# Patient Record
Sex: Male | Born: 1955 | Hispanic: No | Marital: Married | Smoking: Never smoker
Health system: Southern US, Community
[De-identification: ages and names within clinical notes are randomized; demographics above are authoritative.]

---

## 1999-05-05 ENCOUNTER — Encounter: Admission: RE | Admit: 1999-05-05 | Discharge: 1999-08-03 | Payer: Self-pay | Admitting: Neurosurgery

## 2001-02-12 ENCOUNTER — Encounter: Payer: Self-pay | Admitting: General Practice

## 2001-02-12 ENCOUNTER — Encounter: Admission: RE | Admit: 2001-02-12 | Discharge: 2001-02-12 | Payer: Self-pay | Admitting: General Practice

## 2002-05-02 ENCOUNTER — Ambulatory Visit (HOSPITAL_COMMUNITY): Admission: RE | Admit: 2002-05-02 | Discharge: 2002-05-02 | Payer: Self-pay | Admitting: *Deleted

## 2011-11-29 DIAGNOSIS — Z79899 Other long term (current) drug therapy: Secondary | ICD-10-CM | POA: Diagnosis not present

## 2011-11-29 DIAGNOSIS — M62838 Other muscle spasm: Secondary | ICD-10-CM | POA: Diagnosis not present

## 2011-11-29 DIAGNOSIS — M961 Postlaminectomy syndrome, not elsewhere classified: Secondary | ICD-10-CM | POA: Diagnosis not present

## 2011-12-15 DIAGNOSIS — Z79899 Other long term (current) drug therapy: Secondary | ICD-10-CM | POA: Diagnosis not present

## 2011-12-15 DIAGNOSIS — Z136 Encounter for screening for cardiovascular disorders: Secondary | ICD-10-CM | POA: Diagnosis not present

## 2011-12-15 DIAGNOSIS — Z0000 Encounter for general adult medical examination without abnormal findings: Secondary | ICD-10-CM | POA: Diagnosis not present

## 2011-12-15 DIAGNOSIS — J309 Allergic rhinitis, unspecified: Secondary | ICD-10-CM | POA: Diagnosis not present

## 2011-12-15 DIAGNOSIS — Z125 Encounter for screening for malignant neoplasm of prostate: Secondary | ICD-10-CM | POA: Diagnosis not present

## 2011-12-15 DIAGNOSIS — Z6825 Body mass index (BMI) 25.0-25.9, adult: Secondary | ICD-10-CM | POA: Diagnosis not present

## 2012-04-03 DIAGNOSIS — M961 Postlaminectomy syndrome, not elsewhere classified: Secondary | ICD-10-CM | POA: Insufficient documentation

## 2012-04-03 DIAGNOSIS — Z79899 Other long term (current) drug therapy: Secondary | ICD-10-CM | POA: Diagnosis not present

## 2012-04-03 DIAGNOSIS — IMO0002 Reserved for concepts with insufficient information to code with codable children: Secondary | ICD-10-CM | POA: Diagnosis not present

## 2012-06-10 DIAGNOSIS — R634 Abnormal weight loss: Secondary | ICD-10-CM | POA: Diagnosis not present

## 2012-06-11 DIAGNOSIS — R634 Abnormal weight loss: Secondary | ICD-10-CM | POA: Diagnosis not present

## 2012-07-23 DIAGNOSIS — F1190 Opioid use, unspecified, uncomplicated: Secondary | ICD-10-CM | POA: Insufficient documentation

## 2012-07-23 DIAGNOSIS — M5410 Radiculopathy, site unspecified: Secondary | ICD-10-CM | POA: Insufficient documentation

## 2012-09-16 DIAGNOSIS — Z23 Encounter for immunization: Secondary | ICD-10-CM | POA: Diagnosis not present

## 2012-09-24 DIAGNOSIS — F32A Depression, unspecified: Secondary | ICD-10-CM | POA: Insufficient documentation

## 2012-09-24 DIAGNOSIS — IMO0002 Reserved for concepts with insufficient information to code with codable children: Secondary | ICD-10-CM | POA: Diagnosis not present

## 2012-09-24 DIAGNOSIS — M961 Postlaminectomy syndrome, not elsewhere classified: Secondary | ICD-10-CM | POA: Diagnosis not present

## 2012-12-23 DIAGNOSIS — Z79899 Other long term (current) drug therapy: Secondary | ICD-10-CM | POA: Diagnosis not present

## 2012-12-23 DIAGNOSIS — Z125 Encounter for screening for malignant neoplasm of prostate: Secondary | ICD-10-CM | POA: Diagnosis not present

## 2012-12-23 DIAGNOSIS — Z136 Encounter for screening for cardiovascular disorders: Secondary | ICD-10-CM | POA: Diagnosis not present

## 2012-12-24 DIAGNOSIS — J309 Allergic rhinitis, unspecified: Secondary | ICD-10-CM | POA: Diagnosis not present

## 2012-12-24 DIAGNOSIS — IMO0002 Reserved for concepts with insufficient information to code with codable children: Secondary | ICD-10-CM | POA: Diagnosis not present

## 2012-12-24 DIAGNOSIS — F411 Generalized anxiety disorder: Secondary | ICD-10-CM | POA: Diagnosis not present

## 2012-12-24 DIAGNOSIS — Z0000 Encounter for general adult medical examination without abnormal findings: Secondary | ICD-10-CM | POA: Diagnosis not present

## 2013-03-25 DIAGNOSIS — G894 Chronic pain syndrome: Secondary | ICD-10-CM | POA: Insufficient documentation

## 2013-06-24 DIAGNOSIS — M545 Low back pain, unspecified: Secondary | ICD-10-CM | POA: Diagnosis not present

## 2013-06-24 DIAGNOSIS — F411 Generalized anxiety disorder: Secondary | ICD-10-CM | POA: Diagnosis not present

## 2013-06-24 DIAGNOSIS — J309 Allergic rhinitis, unspecified: Secondary | ICD-10-CM | POA: Diagnosis not present

## 2013-10-02 DIAGNOSIS — Z23 Encounter for immunization: Secondary | ICD-10-CM | POA: Diagnosis not present

## 2013-12-26 DIAGNOSIS — Z136 Encounter for screening for cardiovascular disorders: Secondary | ICD-10-CM | POA: Diagnosis not present

## 2013-12-26 DIAGNOSIS — M545 Low back pain, unspecified: Secondary | ICD-10-CM | POA: Diagnosis not present

## 2013-12-26 DIAGNOSIS — Z23 Encounter for immunization: Secondary | ICD-10-CM | POA: Diagnosis not present

## 2013-12-26 DIAGNOSIS — J309 Allergic rhinitis, unspecified: Secondary | ICD-10-CM | POA: Diagnosis not present

## 2013-12-26 DIAGNOSIS — Z5181 Encounter for therapeutic drug level monitoring: Secondary | ICD-10-CM | POA: Diagnosis not present

## 2013-12-26 DIAGNOSIS — Z125 Encounter for screening for malignant neoplasm of prostate: Secondary | ICD-10-CM | POA: Diagnosis not present

## 2013-12-26 DIAGNOSIS — Z0000 Encounter for general adult medical examination without abnormal findings: Secondary | ICD-10-CM | POA: Diagnosis not present

## 2013-12-26 DIAGNOSIS — F411 Generalized anxiety disorder: Secondary | ICD-10-CM | POA: Diagnosis not present

## 2013-12-26 DIAGNOSIS — Z79899 Other long term (current) drug therapy: Secondary | ICD-10-CM | POA: Diagnosis not present

## 2014-10-01 DIAGNOSIS — Z23 Encounter for immunization: Secondary | ICD-10-CM | POA: Diagnosis not present

## 2015-08-10 ENCOUNTER — Other Ambulatory Visit: Payer: Self-pay | Admitting: Otolaryngology

## 2015-08-10 DIAGNOSIS — R221 Localized swelling, mass and lump, neck: Secondary | ICD-10-CM

## 2015-08-23 ENCOUNTER — Ambulatory Visit
Admission: RE | Admit: 2015-08-23 | Discharge: 2015-08-23 | Disposition: A | Discharge status: Home / Self Care | Payer: Medicare Other | Source: Ambulatory Visit | Attending: Otolaryngology | Admitting: Otolaryngology

## 2015-08-23 DIAGNOSIS — R599 Enlarged lymph nodes, unspecified: Secondary | ICD-10-CM | POA: Diagnosis present

## 2015-08-23 DIAGNOSIS — R221 Localized swelling, mass and lump, neck: Secondary | ICD-10-CM | POA: Diagnosis not present

## 2015-08-23 DIAGNOSIS — M542 Cervicalgia: Secondary | ICD-10-CM | POA: Diagnosis present

## 2015-08-23 MED ORDER — IOHEXOL 300 MG/ML  SOLN
75.0000 mL | Freq: Once | INTRAMUSCULAR | Status: AC | PRN
  Administered 2015-08-23: 75 mL via INTRAVENOUS

## 2015-12-13 DIAGNOSIS — D125 Benign neoplasm of sigmoid colon: Secondary | ICD-10-CM | POA: Diagnosis not present

## 2016-01-03 DIAGNOSIS — Z6826 Body mass index (BMI) 26.0-26.9, adult: Secondary | ICD-10-CM | POA: Diagnosis not present

## 2016-01-03 DIAGNOSIS — Z79899 Other long term (current) drug therapy: Secondary | ICD-10-CM | POA: Diagnosis not present

## 2016-01-03 DIAGNOSIS — Z0000 Encounter for general adult medical examination without abnormal findings: Secondary | ICD-10-CM | POA: Diagnosis not present

## 2016-01-03 DIAGNOSIS — F419 Anxiety disorder, unspecified: Secondary | ICD-10-CM | POA: Diagnosis not present

## 2016-01-03 DIAGNOSIS — Z125 Encounter for screening for malignant neoplasm of prostate: Secondary | ICD-10-CM | POA: Diagnosis not present

## 2016-01-03 DIAGNOSIS — E663 Overweight: Secondary | ICD-10-CM | POA: Diagnosis not present

## 2016-01-03 DIAGNOSIS — M545 Low back pain: Secondary | ICD-10-CM | POA: Diagnosis not present

## 2016-01-03 DIAGNOSIS — Z1389 Encounter for screening for other disorder: Secondary | ICD-10-CM | POA: Diagnosis not present

## 2016-01-03 DIAGNOSIS — Z7289 Other problems related to lifestyle: Secondary | ICD-10-CM | POA: Diagnosis not present

## 2016-10-29 IMAGING — CT CT NECK W/ CM
4 of 5 series · 16 of 33 positions shown, 19 images · IV contrast (omnipaque)
Comparison: None.

CLINICAL DATA: Neck swelling. Left parotid fullness. Enlarged lymph
nodes.

EXAM:
CT NECK WITH CONTRAST
TECHNIQUE: Multidetector CT imaging of the neck was performed using the
standard protocol following the bolus administration of intravenous
contrast.
CONTRAST:  75mL OMNIPAQUE IOHEXOL 300 MG/ML  SOLN

[Series 2: axial · axial · 0.46mm/px · z∈[-280,-162]mm · 3 of 119 slices shown]
[im 30/119  bone]
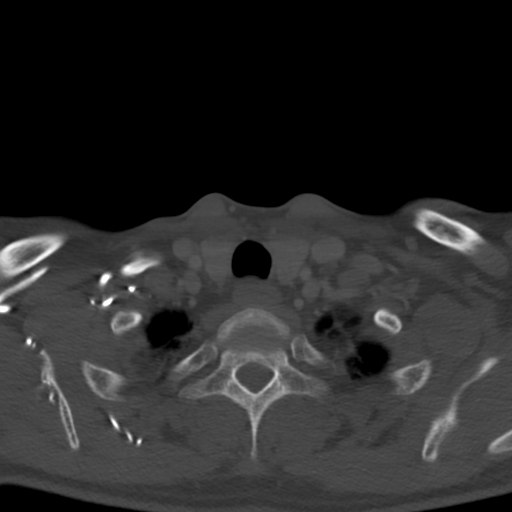
[im 60/119  bone]
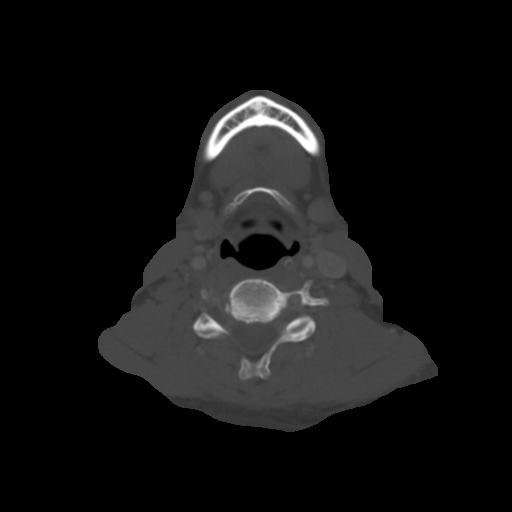
[im 89/119  bone]
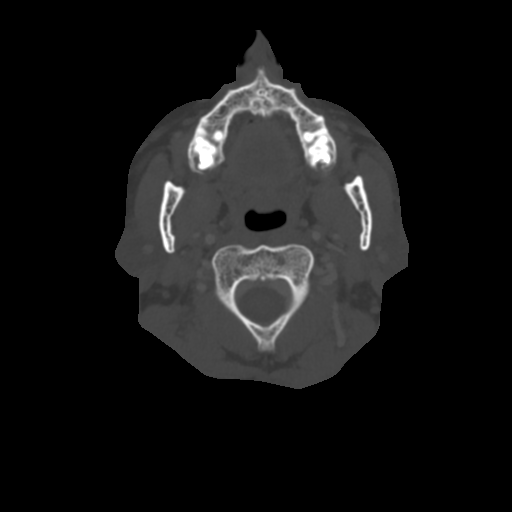

[Series 4: sag neck · sagittal · 0.47mm/px · 5 of 101 slices shown, 6 images]
[im 34/101  bone]
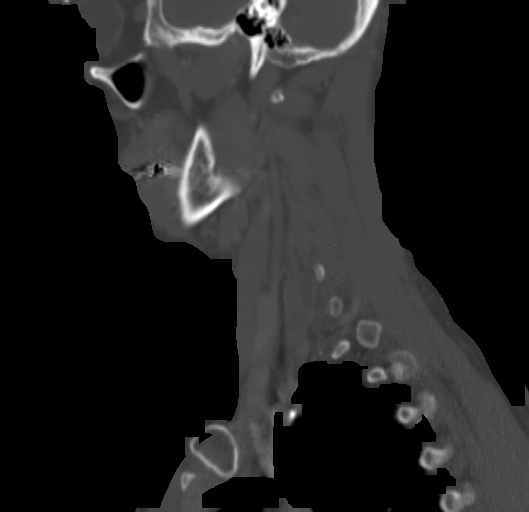
[im 42/101  bone]
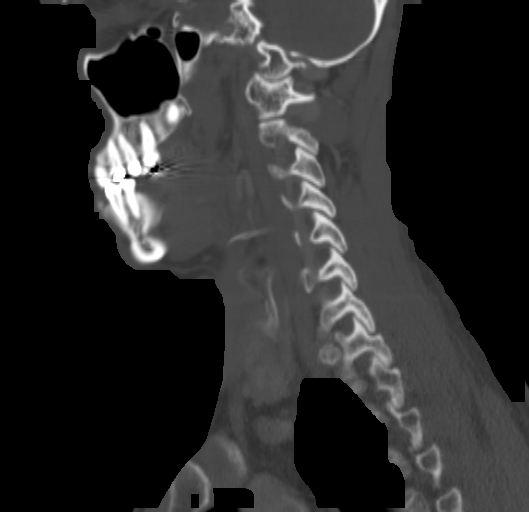
[im 51/101  soft-tissue]
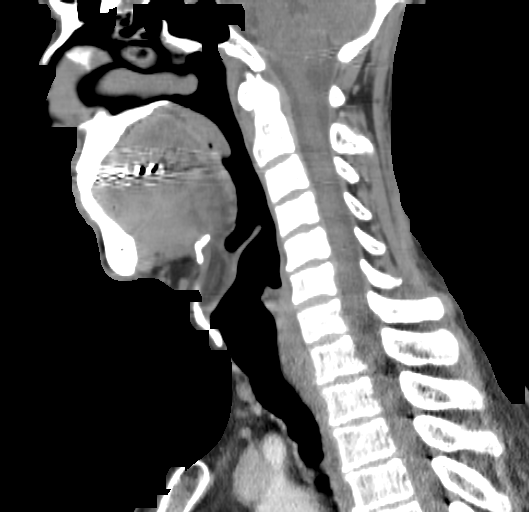
[im 51/101  bone]
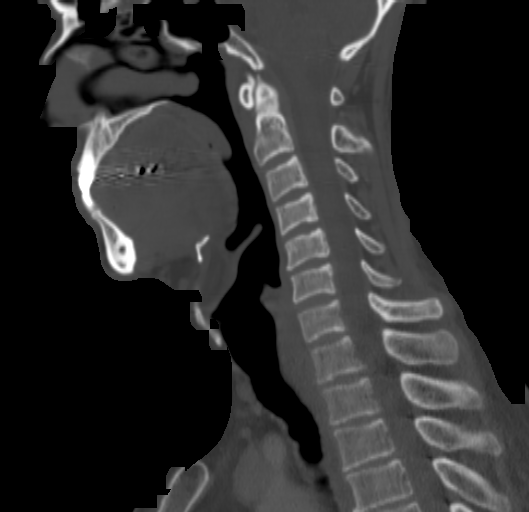
[im 59/101  bone]
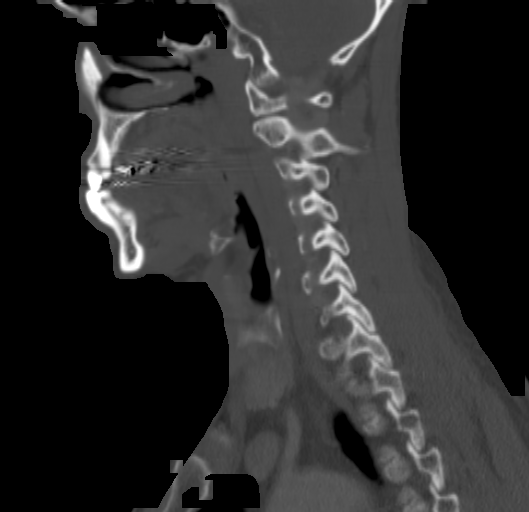
[im 67/101  bone]
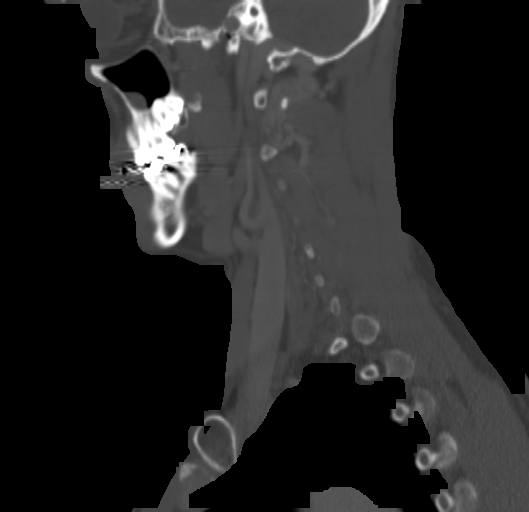

[Series 5: cor neck · coronal · 0.42mm/px · 3 of 113 slices shown]
[im 23/113  bone]
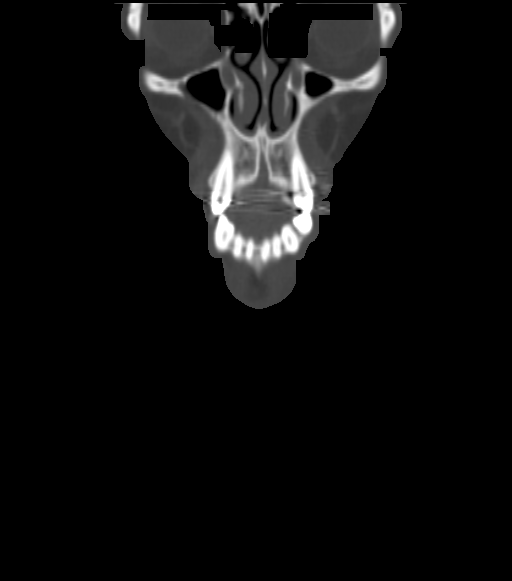
[im 45/113  bone]
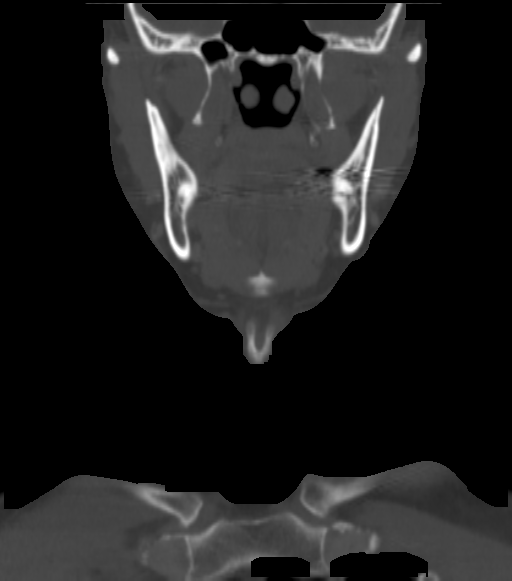
[im 68/113  bone]
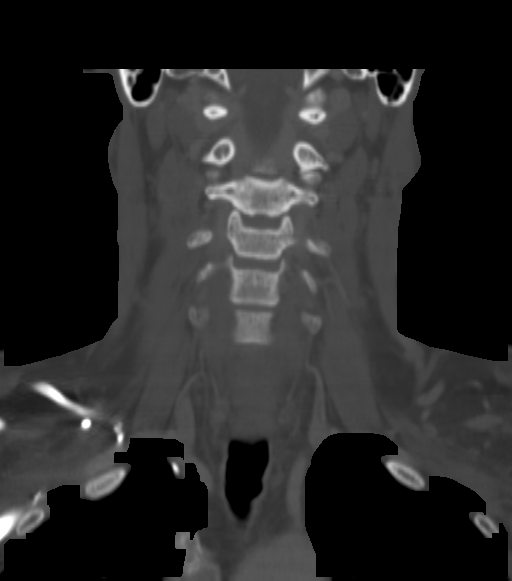

[Series 6: ax oropharynx · axial · 0.39mm/px · z∈[-345,-157]mm · 5 of 148 slices shown, 7 images]
[im 25/148  soft-tissue]
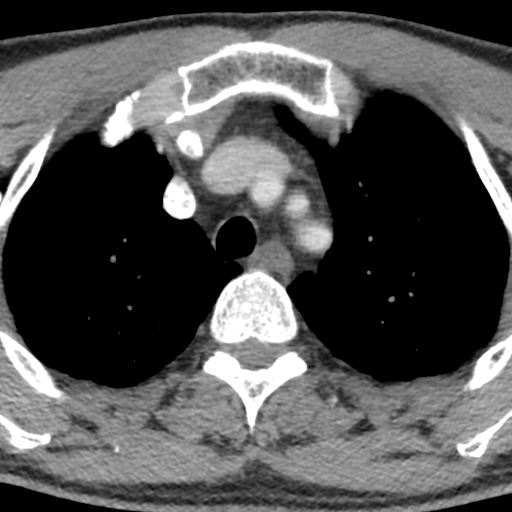
[im 25/148  bone]
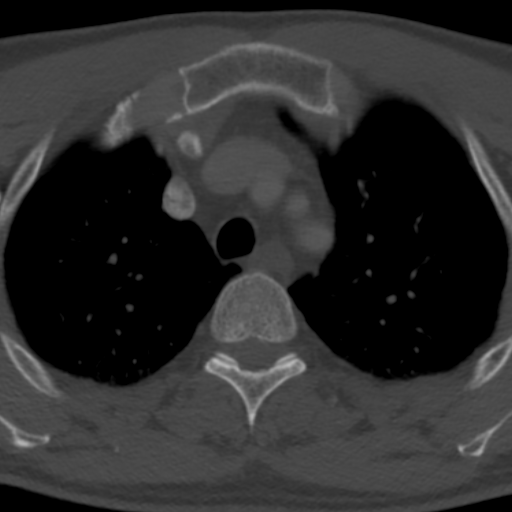
[im 50/148  bone]
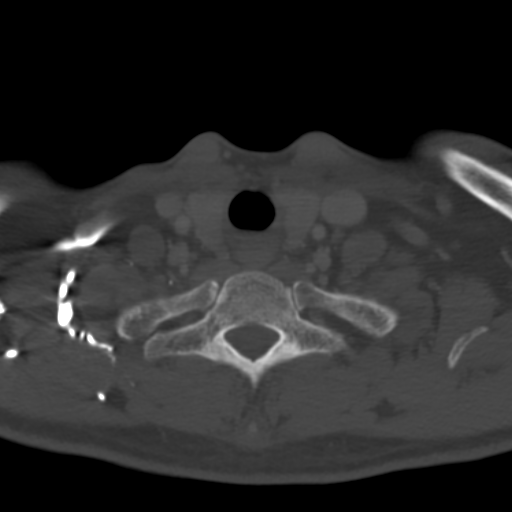
[im 74/148  bone]
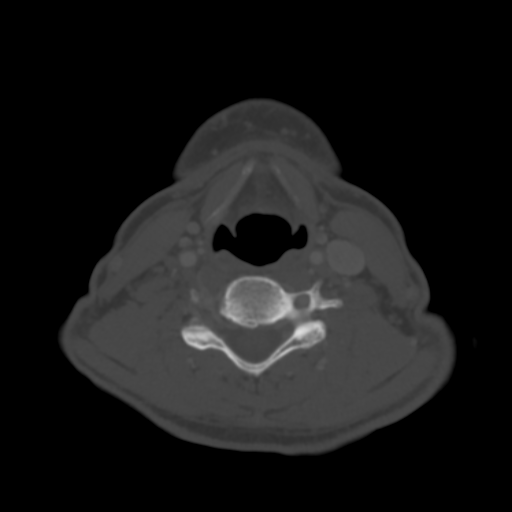
[im 99/148  bone]
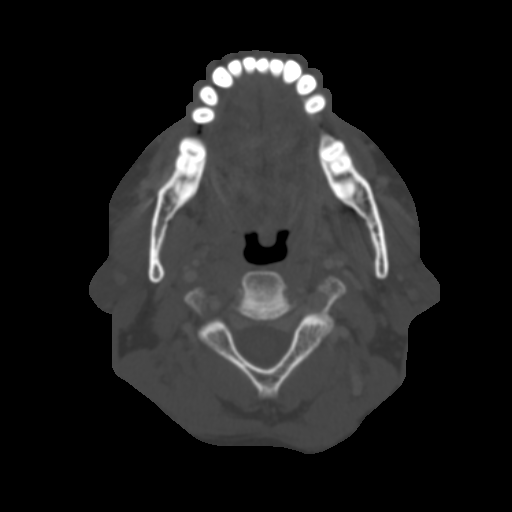
[im 123/148  soft-tissue]
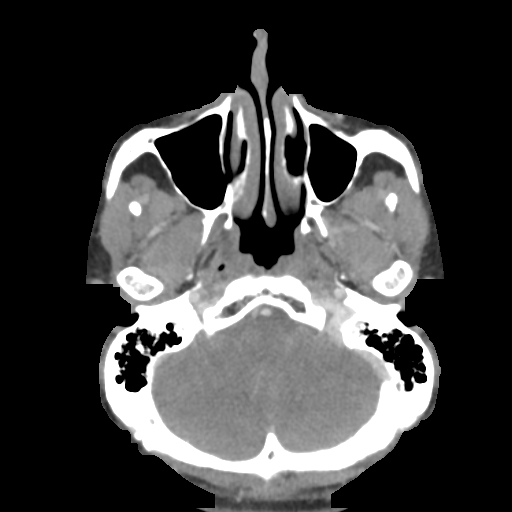
[im 123/148  bone]
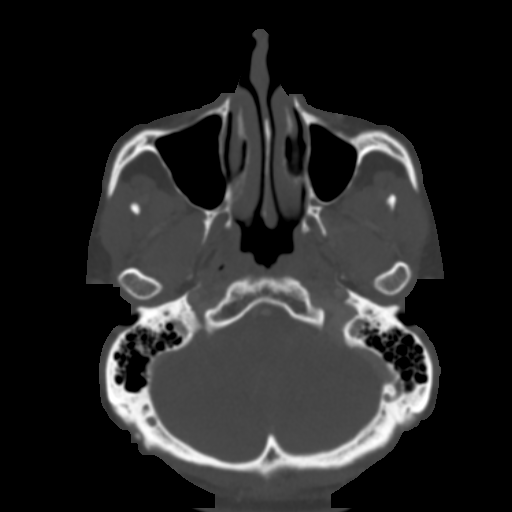

[16 of 33 positions shown; findings below may reference images not displayed]

FINDINGS: Pharynx and larynx: No focal mucosal or submucosal lesions are
present. Vocal cords are midline and symmetric. The tongue base is
unremarkable. Calcifications within the palatine tonsils are
compatible with prior infection or inflammation. The parapharyngeal
fat is clear.

Salivary glands: The parotid glands are within normal limits
bilaterally. No discrete lesion is present. There is no focal lesion
subjacent to the area marked. A subtle area of skin thickening is
noted just anterior and inferior to the area marked. The
submandibular glands are within normal limits.

Thyroid: Negative

Lymph nodes: No significant cervical adenopathy is present.

Vascular: Negative

Limited intracranial: Negative

Visualized orbits: Within normal limits

Mastoids and visualized paranasal sinuses: Clear

Skeleton: Vertebral body heights and alignment are maintained. No
focal lytic or blastic lesions are present.

Upper chest: The lung apices are clear.
IMPRESSION: 1. No acute or focal lesion involving the either parotid gland.
2. Slight skin induration near the area marked could represent a
dermal lesion. Direct inspection is recommended.
3. No significant adenopathy.

## 2017-01-03 DIAGNOSIS — Z79899 Other long term (current) drug therapy: Secondary | ICD-10-CM | POA: Diagnosis not present

## 2017-01-03 DIAGNOSIS — M545 Low back pain: Secondary | ICD-10-CM | POA: Diagnosis not present

## 2017-01-03 DIAGNOSIS — Z6824 Body mass index (BMI) 24.0-24.9, adult: Secondary | ICD-10-CM | POA: Diagnosis not present

## 2017-01-03 DIAGNOSIS — N400 Enlarged prostate without lower urinary tract symptoms: Secondary | ICD-10-CM | POA: Diagnosis not present

## 2017-01-03 DIAGNOSIS — Z0000 Encounter for general adult medical examination without abnormal findings: Secondary | ICD-10-CM | POA: Diagnosis not present

## 2017-01-03 DIAGNOSIS — K219 Gastro-esophageal reflux disease without esophagitis: Secondary | ICD-10-CM | POA: Diagnosis not present

## 2017-01-24 DIAGNOSIS — E662 Morbid (severe) obesity with alveolar hypoventilation: Secondary | ICD-10-CM | POA: Diagnosis not present

## 2017-01-24 DIAGNOSIS — R072 Precordial pain: Secondary | ICD-10-CM | POA: Diagnosis not present

## 2017-01-24 DIAGNOSIS — E663 Overweight: Secondary | ICD-10-CM | POA: Diagnosis not present

## 2017-01-24 DIAGNOSIS — Z6825 Body mass index (BMI) 25.0-25.9, adult: Secondary | ICD-10-CM | POA: Diagnosis not present

## 2017-02-05 DIAGNOSIS — R079 Chest pain, unspecified: Secondary | ICD-10-CM | POA: Diagnosis not present

## 2017-02-05 DIAGNOSIS — Z6825 Body mass index (BMI) 25.0-25.9, adult: Secondary | ICD-10-CM | POA: Diagnosis not present

## 2017-02-07 DIAGNOSIS — R079 Chest pain, unspecified: Secondary | ICD-10-CM | POA: Diagnosis not present

## 2017-02-07 DIAGNOSIS — R072 Precordial pain: Secondary | ICD-10-CM | POA: Diagnosis not present

## 2017-02-07 DIAGNOSIS — K2900 Acute gastritis without bleeding: Secondary | ICD-10-CM | POA: Diagnosis not present

## 2017-04-24 DIAGNOSIS — R12 Heartburn: Secondary | ICD-10-CM | POA: Diagnosis not present

## 2017-04-25 DIAGNOSIS — R079 Chest pain, unspecified: Secondary | ICD-10-CM | POA: Diagnosis not present

## 2017-04-25 DIAGNOSIS — R0789 Other chest pain: Secondary | ICD-10-CM | POA: Diagnosis not present

## 2017-04-25 DIAGNOSIS — K219 Gastro-esophageal reflux disease without esophagitis: Secondary | ICD-10-CM | POA: Diagnosis not present

## 2017-04-25 DIAGNOSIS — R12 Heartburn: Secondary | ICD-10-CM | POA: Diagnosis not present

## 2017-05-29 DIAGNOSIS — K219 Gastro-esophageal reflux disease without esophagitis: Secondary | ICD-10-CM | POA: Diagnosis not present

## 2017-06-25 DIAGNOSIS — F329 Major depressive disorder, single episode, unspecified: Secondary | ICD-10-CM | POA: Diagnosis not present

## 2017-06-25 DIAGNOSIS — Z1389 Encounter for screening for other disorder: Secondary | ICD-10-CM | POA: Diagnosis not present

## 2017-06-25 DIAGNOSIS — Z6825 Body mass index (BMI) 25.0-25.9, adult: Secondary | ICD-10-CM | POA: Diagnosis not present

## 2017-06-25 DIAGNOSIS — M545 Low back pain: Secondary | ICD-10-CM | POA: Diagnosis not present

## 2017-06-25 DIAGNOSIS — K219 Gastro-esophageal reflux disease without esophagitis: Secondary | ICD-10-CM | POA: Diagnosis not present

## 2017-06-25 DIAGNOSIS — Z79899 Other long term (current) drug therapy: Secondary | ICD-10-CM | POA: Diagnosis not present

## 2017-07-16 DIAGNOSIS — Z6824 Body mass index (BMI) 24.0-24.9, adult: Secondary | ICD-10-CM | POA: Diagnosis not present

## 2017-07-16 DIAGNOSIS — R109 Unspecified abdominal pain: Secondary | ICD-10-CM | POA: Diagnosis not present

## 2017-07-27 DIAGNOSIS — R109 Unspecified abdominal pain: Secondary | ICD-10-CM | POA: Diagnosis not present

## 2017-07-27 DIAGNOSIS — R319 Hematuria, unspecified: Secondary | ICD-10-CM | POA: Diagnosis not present

## 2017-07-27 DIAGNOSIS — N433 Hydrocele, unspecified: Secondary | ICD-10-CM | POA: Diagnosis not present

## 2017-09-25 DIAGNOSIS — M545 Low back pain: Secondary | ICD-10-CM | POA: Diagnosis not present

## 2017-09-25 DIAGNOSIS — F329 Major depressive disorder, single episode, unspecified: Secondary | ICD-10-CM | POA: Diagnosis not present

## 2017-09-25 DIAGNOSIS — Z79899 Other long term (current) drug therapy: Secondary | ICD-10-CM | POA: Diagnosis not present

## 2017-09-25 DIAGNOSIS — K219 Gastro-esophageal reflux disease without esophagitis: Secondary | ICD-10-CM | POA: Diagnosis not present

## 2017-09-25 DIAGNOSIS — Z23 Encounter for immunization: Secondary | ICD-10-CM | POA: Diagnosis not present

## 2017-11-27 DIAGNOSIS — Z6825 Body mass index (BMI) 25.0-25.9, adult: Secondary | ICD-10-CM | POA: Diagnosis not present

## 2017-11-27 DIAGNOSIS — M545 Low back pain: Secondary | ICD-10-CM | POA: Diagnosis not present

## 2017-12-13 DIAGNOSIS — H524 Presbyopia: Secondary | ICD-10-CM | POA: Diagnosis not present

## 2017-12-18 DIAGNOSIS — M545 Low back pain: Secondary | ICD-10-CM | POA: Diagnosis not present

## 2017-12-18 DIAGNOSIS — R69 Illness, unspecified: Secondary | ICD-10-CM | POA: Diagnosis not present

## 2017-12-18 DIAGNOSIS — M542 Cervicalgia: Secondary | ICD-10-CM | POA: Diagnosis not present

## 2018-01-07 DIAGNOSIS — Z125 Encounter for screening for malignant neoplasm of prostate: Secondary | ICD-10-CM | POA: Diagnosis not present

## 2018-01-07 DIAGNOSIS — Z0000 Encounter for general adult medical examination without abnormal findings: Secondary | ICD-10-CM | POA: Diagnosis not present

## 2018-01-07 DIAGNOSIS — Z23 Encounter for immunization: Secondary | ICD-10-CM | POA: Diagnosis not present

## 2018-02-28 DIAGNOSIS — Z13220 Encounter for screening for lipoid disorders: Secondary | ICD-10-CM | POA: Diagnosis not present

## 2018-02-28 DIAGNOSIS — Z79899 Other long term (current) drug therapy: Secondary | ICD-10-CM | POA: Diagnosis not present

## 2018-02-28 DIAGNOSIS — Z125 Encounter for screening for malignant neoplasm of prostate: Secondary | ICD-10-CM | POA: Diagnosis not present

## 2018-03-04 DIAGNOSIS — Z79899 Other long term (current) drug therapy: Secondary | ICD-10-CM | POA: Diagnosis not present

## 2018-03-04 DIAGNOSIS — M545 Low back pain: Secondary | ICD-10-CM | POA: Diagnosis not present

## 2018-03-04 DIAGNOSIS — Z6824 Body mass index (BMI) 24.0-24.9, adult: Secondary | ICD-10-CM | POA: Diagnosis not present

## 2018-03-27 DIAGNOSIS — M545 Low back pain: Secondary | ICD-10-CM | POA: Diagnosis not present

## 2018-03-27 DIAGNOSIS — M6281 Muscle weakness (generalized): Secondary | ICD-10-CM | POA: Diagnosis not present

## 2018-04-03 DIAGNOSIS — M6281 Muscle weakness (generalized): Secondary | ICD-10-CM | POA: Diagnosis not present

## 2018-04-03 DIAGNOSIS — M545 Low back pain: Secondary | ICD-10-CM | POA: Diagnosis not present

## 2018-04-10 DIAGNOSIS — M545 Low back pain: Secondary | ICD-10-CM | POA: Diagnosis not present

## 2018-04-10 DIAGNOSIS — M6281 Muscle weakness (generalized): Secondary | ICD-10-CM | POA: Diagnosis not present

## 2018-04-17 DIAGNOSIS — M6281 Muscle weakness (generalized): Secondary | ICD-10-CM | POA: Diagnosis not present

## 2018-04-17 DIAGNOSIS — M545 Low back pain: Secondary | ICD-10-CM | POA: Diagnosis not present

## 2018-04-24 DIAGNOSIS — M545 Low back pain: Secondary | ICD-10-CM | POA: Diagnosis not present

## 2018-04-24 DIAGNOSIS — M6281 Muscle weakness (generalized): Secondary | ICD-10-CM | POA: Diagnosis not present

## 2018-05-01 DIAGNOSIS — M6281 Muscle weakness (generalized): Secondary | ICD-10-CM | POA: Diagnosis not present

## 2018-05-01 DIAGNOSIS — M545 Low back pain: Secondary | ICD-10-CM | POA: Diagnosis not present

## 2018-05-08 DIAGNOSIS — M6281 Muscle weakness (generalized): Secondary | ICD-10-CM | POA: Diagnosis not present

## 2018-05-08 DIAGNOSIS — M545 Low back pain: Secondary | ICD-10-CM | POA: Diagnosis not present

## 2018-05-14 DIAGNOSIS — M6281 Muscle weakness (generalized): Secondary | ICD-10-CM | POA: Diagnosis not present

## 2018-05-14 DIAGNOSIS — M545 Low back pain: Secondary | ICD-10-CM | POA: Diagnosis not present

## 2018-06-05 DIAGNOSIS — M1990 Unspecified osteoarthritis, unspecified site: Secondary | ICD-10-CM | POA: Diagnosis not present

## 2018-06-05 DIAGNOSIS — H547 Unspecified visual loss: Secondary | ICD-10-CM | POA: Diagnosis not present

## 2018-06-05 DIAGNOSIS — G629 Polyneuropathy, unspecified: Secondary | ICD-10-CM | POA: Diagnosis not present

## 2018-06-05 DIAGNOSIS — G8929 Other chronic pain: Secondary | ICD-10-CM | POA: Diagnosis not present

## 2018-06-05 DIAGNOSIS — H269 Unspecified cataract: Secondary | ICD-10-CM | POA: Diagnosis not present

## 2018-06-05 DIAGNOSIS — F4321 Adjustment disorder with depressed mood: Secondary | ICD-10-CM | POA: Diagnosis not present

## 2018-06-05 DIAGNOSIS — Z791 Long term (current) use of non-steroidal anti-inflammatories (NSAID): Secondary | ICD-10-CM | POA: Diagnosis not present

## 2018-06-05 DIAGNOSIS — R69 Illness, unspecified: Secondary | ICD-10-CM | POA: Diagnosis not present

## 2018-06-05 DIAGNOSIS — J309 Allergic rhinitis, unspecified: Secondary | ICD-10-CM | POA: Diagnosis not present

## 2018-06-05 DIAGNOSIS — K219 Gastro-esophageal reflux disease without esophagitis: Secondary | ICD-10-CM | POA: Diagnosis not present

## 2018-07-01 DIAGNOSIS — Z1339 Encounter for screening examination for other mental health and behavioral disorders: Secondary | ICD-10-CM | POA: Diagnosis not present

## 2018-07-01 DIAGNOSIS — K219 Gastro-esophageal reflux disease without esophagitis: Secondary | ICD-10-CM | POA: Diagnosis not present

## 2018-07-01 DIAGNOSIS — Z1331 Encounter for screening for depression: Secondary | ICD-10-CM | POA: Diagnosis not present

## 2018-07-01 DIAGNOSIS — J309 Allergic rhinitis, unspecified: Secondary | ICD-10-CM | POA: Diagnosis not present

## 2018-07-01 DIAGNOSIS — M545 Low back pain: Secondary | ICD-10-CM | POA: Diagnosis not present

## 2018-07-01 DIAGNOSIS — Z6824 Body mass index (BMI) 24.0-24.9, adult: Secondary | ICD-10-CM | POA: Diagnosis not present

## 2018-07-16 DIAGNOSIS — R69 Illness, unspecified: Secondary | ICD-10-CM | POA: Diagnosis not present

## 2018-09-03 DIAGNOSIS — Z23 Encounter for immunization: Secondary | ICD-10-CM | POA: Diagnosis not present

## 2018-10-01 DIAGNOSIS — Z1331 Encounter for screening for depression: Secondary | ICD-10-CM | POA: Diagnosis not present

## 2018-10-01 DIAGNOSIS — Z6824 Body mass index (BMI) 24.0-24.9, adult: Secondary | ICD-10-CM | POA: Diagnosis not present

## 2018-10-01 DIAGNOSIS — M545 Low back pain: Secondary | ICD-10-CM | POA: Diagnosis not present

## 2018-10-01 DIAGNOSIS — K219 Gastro-esophageal reflux disease without esophagitis: Secondary | ICD-10-CM | POA: Diagnosis not present

## 2018-10-01 DIAGNOSIS — Z79899 Other long term (current) drug therapy: Secondary | ICD-10-CM | POA: Diagnosis not present

## 2018-11-27 DIAGNOSIS — Z6824 Body mass index (BMI) 24.0-24.9, adult: Secondary | ICD-10-CM | POA: Diagnosis not present

## 2018-11-27 DIAGNOSIS — J209 Acute bronchitis, unspecified: Secondary | ICD-10-CM | POA: Diagnosis not present

## 2018-11-27 DIAGNOSIS — J019 Acute sinusitis, unspecified: Secondary | ICD-10-CM | POA: Diagnosis not present

## 2018-12-12 DIAGNOSIS — G8929 Other chronic pain: Secondary | ICD-10-CM | POA: Diagnosis not present

## 2018-12-12 DIAGNOSIS — J309 Allergic rhinitis, unspecified: Secondary | ICD-10-CM | POA: Diagnosis not present

## 2018-12-12 DIAGNOSIS — K219 Gastro-esophageal reflux disease without esophagitis: Secondary | ICD-10-CM | POA: Diagnosis not present

## 2018-12-12 DIAGNOSIS — Z809 Family history of malignant neoplasm, unspecified: Secondary | ICD-10-CM | POA: Diagnosis not present

## 2018-12-12 DIAGNOSIS — G629 Polyneuropathy, unspecified: Secondary | ICD-10-CM | POA: Diagnosis not present

## 2018-12-12 DIAGNOSIS — Z833 Family history of diabetes mellitus: Secondary | ICD-10-CM | POA: Diagnosis not present

## 2018-12-12 DIAGNOSIS — Z791 Long term (current) use of non-steroidal anti-inflammatories (NSAID): Secondary | ICD-10-CM | POA: Diagnosis not present

## 2018-12-12 DIAGNOSIS — Z8249 Family history of ischemic heart disease and other diseases of the circulatory system: Secondary | ICD-10-CM | POA: Diagnosis not present

## 2018-12-12 DIAGNOSIS — Z79891 Long term (current) use of opiate analgesic: Secondary | ICD-10-CM | POA: Diagnosis not present

## 2019-01-01 DIAGNOSIS — M67912 Unspecified disorder of synovium and tendon, left shoulder: Secondary | ICD-10-CM | POA: Diagnosis not present

## 2019-01-01 DIAGNOSIS — M545 Low back pain: Secondary | ICD-10-CM | POA: Diagnosis not present

## 2019-01-01 DIAGNOSIS — J309 Allergic rhinitis, unspecified: Secondary | ICD-10-CM | POA: Diagnosis not present

## 2019-01-01 DIAGNOSIS — Z6824 Body mass index (BMI) 24.0-24.9, adult: Secondary | ICD-10-CM | POA: Diagnosis not present

## 2019-01-01 DIAGNOSIS — K219 Gastro-esophageal reflux disease without esophagitis: Secondary | ICD-10-CM | POA: Diagnosis not present

## 2019-02-25 DIAGNOSIS — R69 Illness, unspecified: Secondary | ICD-10-CM | POA: Diagnosis not present

## 2019-03-13 DIAGNOSIS — Z1331 Encounter for screening for depression: Secondary | ICD-10-CM | POA: Diagnosis not present

## 2019-03-13 DIAGNOSIS — Z125 Encounter for screening for malignant neoplasm of prostate: Secondary | ICD-10-CM | POA: Diagnosis not present

## 2019-03-13 DIAGNOSIS — Z0000 Encounter for general adult medical examination without abnormal findings: Secondary | ICD-10-CM | POA: Diagnosis not present

## 2019-03-13 DIAGNOSIS — Z9181 History of falling: Secondary | ICD-10-CM | POA: Diagnosis not present

## 2019-04-07 DIAGNOSIS — K219 Gastro-esophageal reflux disease without esophagitis: Secondary | ICD-10-CM | POA: Diagnosis not present

## 2019-04-07 DIAGNOSIS — Z125 Encounter for screening for malignant neoplasm of prostate: Secondary | ICD-10-CM | POA: Diagnosis not present

## 2019-04-07 DIAGNOSIS — J309 Allergic rhinitis, unspecified: Secondary | ICD-10-CM | POA: Diagnosis not present

## 2019-04-07 DIAGNOSIS — Z13220 Encounter for screening for lipoid disorders: Secondary | ICD-10-CM | POA: Diagnosis not present

## 2019-04-07 DIAGNOSIS — M545 Low back pain: Secondary | ICD-10-CM | POA: Diagnosis not present

## 2019-04-07 DIAGNOSIS — Z6824 Body mass index (BMI) 24.0-24.9, adult: Secondary | ICD-10-CM | POA: Diagnosis not present

## 2019-04-07 DIAGNOSIS — Z79899 Other long term (current) drug therapy: Secondary | ICD-10-CM | POA: Diagnosis not present

## 2019-07-15 DIAGNOSIS — Z6823 Body mass index (BMI) 23.0-23.9, adult: Secondary | ICD-10-CM | POA: Diagnosis not present

## 2019-07-15 DIAGNOSIS — K219 Gastro-esophageal reflux disease without esophagitis: Secondary | ICD-10-CM | POA: Diagnosis not present

## 2019-07-15 DIAGNOSIS — J309 Allergic rhinitis, unspecified: Secondary | ICD-10-CM | POA: Diagnosis not present

## 2019-07-15 DIAGNOSIS — M545 Low back pain: Secondary | ICD-10-CM | POA: Diagnosis not present

## 2019-07-15 DIAGNOSIS — R634 Abnormal weight loss: Secondary | ICD-10-CM | POA: Diagnosis not present

## 2019-09-04 DIAGNOSIS — Z23 Encounter for immunization: Secondary | ICD-10-CM | POA: Diagnosis not present

## 2019-09-09 DIAGNOSIS — R69 Illness, unspecified: Secondary | ICD-10-CM | POA: Diagnosis not present

## 2019-10-20 DIAGNOSIS — J309 Allergic rhinitis, unspecified: Secondary | ICD-10-CM | POA: Diagnosis not present

## 2019-10-20 DIAGNOSIS — M545 Low back pain: Secondary | ICD-10-CM | POA: Diagnosis not present

## 2019-10-20 DIAGNOSIS — K219 Gastro-esophageal reflux disease without esophagitis: Secondary | ICD-10-CM | POA: Diagnosis not present

## 2019-10-20 DIAGNOSIS — Z6824 Body mass index (BMI) 24.0-24.9, adult: Secondary | ICD-10-CM | POA: Diagnosis not present

## 2019-10-20 DIAGNOSIS — Z79899 Other long term (current) drug therapy: Secondary | ICD-10-CM | POA: Diagnosis not present

## 2020-01-21 DIAGNOSIS — H524 Presbyopia: Secondary | ICD-10-CM | POA: Diagnosis not present

## 2020-01-21 DIAGNOSIS — J309 Allergic rhinitis, unspecified: Secondary | ICD-10-CM | POA: Diagnosis not present

## 2020-01-21 DIAGNOSIS — M545 Low back pain: Secondary | ICD-10-CM | POA: Diagnosis not present

## 2020-01-21 DIAGNOSIS — K219 Gastro-esophageal reflux disease without esophagitis: Secondary | ICD-10-CM | POA: Diagnosis not present

## 2020-01-21 DIAGNOSIS — Z6825 Body mass index (BMI) 25.0-25.9, adult: Secondary | ICD-10-CM | POA: Diagnosis not present

## 2020-02-02 DIAGNOSIS — Z0100 Encounter for examination of eyes and vision without abnormal findings: Secondary | ICD-10-CM | POA: Diagnosis not present

## 2020-04-28 DIAGNOSIS — Z79899 Other long term (current) drug therapy: Secondary | ICD-10-CM | POA: Diagnosis not present

## 2020-04-28 DIAGNOSIS — Z125 Encounter for screening for malignant neoplasm of prostate: Secondary | ICD-10-CM | POA: Diagnosis not present

## 2020-04-28 DIAGNOSIS — Z6824 Body mass index (BMI) 24.0-24.9, adult: Secondary | ICD-10-CM | POA: Diagnosis not present

## 2020-04-28 DIAGNOSIS — M545 Low back pain: Secondary | ICD-10-CM | POA: Diagnosis not present

## 2020-04-28 DIAGNOSIS — Z13220 Encounter for screening for lipoid disorders: Secondary | ICD-10-CM | POA: Diagnosis not present

## 2020-04-28 DIAGNOSIS — R5383 Other fatigue: Secondary | ICD-10-CM | POA: Diagnosis not present

## 2020-04-28 DIAGNOSIS — K219 Gastro-esophageal reflux disease without esophagitis: Secondary | ICD-10-CM | POA: Diagnosis not present

## 2020-04-28 DIAGNOSIS — J309 Allergic rhinitis, unspecified: Secondary | ICD-10-CM | POA: Diagnosis not present

## 2020-07-13 DIAGNOSIS — Z1152 Encounter for screening for COVID-19: Secondary | ICD-10-CM | POA: Diagnosis not present

## 2020-07-13 DIAGNOSIS — M25512 Pain in left shoulder: Secondary | ICD-10-CM | POA: Diagnosis not present

## 2020-07-30 DIAGNOSIS — J309 Allergic rhinitis, unspecified: Secondary | ICD-10-CM | POA: Diagnosis not present

## 2020-07-30 DIAGNOSIS — R69 Illness, unspecified: Secondary | ICD-10-CM | POA: Diagnosis not present

## 2020-07-30 DIAGNOSIS — S4352XA Sprain of left acromioclavicular joint, initial encounter: Secondary | ICD-10-CM | POA: Diagnosis not present

## 2020-07-30 DIAGNOSIS — M545 Low back pain: Secondary | ICD-10-CM | POA: Diagnosis not present

## 2020-07-30 DIAGNOSIS — Z6824 Body mass index (BMI) 24.0-24.9, adult: Secondary | ICD-10-CM | POA: Diagnosis not present

## 2020-07-30 DIAGNOSIS — R5383 Other fatigue: Secondary | ICD-10-CM | POA: Diagnosis not present

## 2020-10-29 DIAGNOSIS — J309 Allergic rhinitis, unspecified: Secondary | ICD-10-CM | POA: Diagnosis not present

## 2020-10-29 DIAGNOSIS — Z6824 Body mass index (BMI) 24.0-24.9, adult: Secondary | ICD-10-CM | POA: Diagnosis not present

## 2020-10-29 DIAGNOSIS — K219 Gastro-esophageal reflux disease without esophagitis: Secondary | ICD-10-CM | POA: Diagnosis not present

## 2020-10-29 DIAGNOSIS — Z79899 Other long term (current) drug therapy: Secondary | ICD-10-CM | POA: Diagnosis not present

## 2020-10-29 DIAGNOSIS — E7800 Pure hypercholesterolemia, unspecified: Secondary | ICD-10-CM | POA: Diagnosis not present

## 2020-10-29 DIAGNOSIS — M545 Low back pain, unspecified: Secondary | ICD-10-CM | POA: Diagnosis not present

## 2020-10-29 DIAGNOSIS — Z86010 Personal history of colonic polyps: Secondary | ICD-10-CM | POA: Diagnosis not present

## 2020-12-29 DIAGNOSIS — M6281 Muscle weakness (generalized): Secondary | ICD-10-CM | POA: Diagnosis not present

## 2020-12-29 DIAGNOSIS — M5432 Sciatica, left side: Secondary | ICD-10-CM | POA: Diagnosis not present

## 2020-12-29 DIAGNOSIS — M545 Low back pain, unspecified: Secondary | ICD-10-CM | POA: Diagnosis not present

## 2021-01-04 DIAGNOSIS — M6281 Muscle weakness (generalized): Secondary | ICD-10-CM | POA: Diagnosis not present

## 2021-01-04 DIAGNOSIS — M5432 Sciatica, left side: Secondary | ICD-10-CM | POA: Diagnosis not present

## 2021-01-04 DIAGNOSIS — M545 Low back pain, unspecified: Secondary | ICD-10-CM | POA: Diagnosis not present

## 2021-01-06 DIAGNOSIS — M6281 Muscle weakness (generalized): Secondary | ICD-10-CM | POA: Diagnosis not present

## 2021-01-06 DIAGNOSIS — M5432 Sciatica, left side: Secondary | ICD-10-CM | POA: Diagnosis not present

## 2021-01-06 DIAGNOSIS — M545 Low back pain, unspecified: Secondary | ICD-10-CM | POA: Diagnosis not present

## 2021-01-11 DIAGNOSIS — M545 Low back pain, unspecified: Secondary | ICD-10-CM | POA: Diagnosis not present

## 2021-01-11 DIAGNOSIS — M5432 Sciatica, left side: Secondary | ICD-10-CM | POA: Diagnosis not present

## 2021-01-11 DIAGNOSIS — M6281 Muscle weakness (generalized): Secondary | ICD-10-CM | POA: Diagnosis not present

## 2021-01-13 DIAGNOSIS — M545 Low back pain, unspecified: Secondary | ICD-10-CM | POA: Diagnosis not present

## 2021-01-13 DIAGNOSIS — M5432 Sciatica, left side: Secondary | ICD-10-CM | POA: Diagnosis not present

## 2021-01-13 DIAGNOSIS — M6281 Muscle weakness (generalized): Secondary | ICD-10-CM | POA: Diagnosis not present

## 2021-01-18 DIAGNOSIS — M6281 Muscle weakness (generalized): Secondary | ICD-10-CM | POA: Diagnosis not present

## 2021-01-18 DIAGNOSIS — M545 Low back pain, unspecified: Secondary | ICD-10-CM | POA: Diagnosis not present

## 2021-01-18 DIAGNOSIS — M5432 Sciatica, left side: Secondary | ICD-10-CM | POA: Diagnosis not present

## 2021-01-25 DIAGNOSIS — M545 Low back pain, unspecified: Secondary | ICD-10-CM | POA: Diagnosis not present

## 2021-01-25 DIAGNOSIS — M5432 Sciatica, left side: Secondary | ICD-10-CM | POA: Diagnosis not present

## 2021-01-25 DIAGNOSIS — M6281 Muscle weakness (generalized): Secondary | ICD-10-CM | POA: Diagnosis not present

## 2021-02-01 DIAGNOSIS — M5432 Sciatica, left side: Secondary | ICD-10-CM | POA: Diagnosis not present

## 2021-02-01 DIAGNOSIS — M545 Low back pain, unspecified: Secondary | ICD-10-CM | POA: Diagnosis not present

## 2021-02-01 DIAGNOSIS — M6281 Muscle weakness (generalized): Secondary | ICD-10-CM | POA: Diagnosis not present

## 2021-02-03 DIAGNOSIS — M5432 Sciatica, left side: Secondary | ICD-10-CM | POA: Diagnosis not present

## 2021-02-03 DIAGNOSIS — M6281 Muscle weakness (generalized): Secondary | ICD-10-CM | POA: Diagnosis not present

## 2021-02-03 DIAGNOSIS — M545 Low back pain, unspecified: Secondary | ICD-10-CM | POA: Diagnosis not present

## 2021-02-09 DIAGNOSIS — M5432 Sciatica, left side: Secondary | ICD-10-CM | POA: Diagnosis not present

## 2021-02-09 DIAGNOSIS — M545 Low back pain, unspecified: Secondary | ICD-10-CM | POA: Diagnosis not present

## 2021-02-09 DIAGNOSIS — M6281 Muscle weakness (generalized): Secondary | ICD-10-CM | POA: Diagnosis not present

## 2021-02-15 DIAGNOSIS — M545 Low back pain, unspecified: Secondary | ICD-10-CM | POA: Diagnosis not present

## 2021-02-15 DIAGNOSIS — M6281 Muscle weakness (generalized): Secondary | ICD-10-CM | POA: Diagnosis not present

## 2021-02-15 DIAGNOSIS — M5432 Sciatica, left side: Secondary | ICD-10-CM | POA: Diagnosis not present

## 2021-02-22 DIAGNOSIS — Z6825 Body mass index (BMI) 25.0-25.9, adult: Secondary | ICD-10-CM | POA: Diagnosis not present

## 2021-02-22 DIAGNOSIS — M545 Low back pain, unspecified: Secondary | ICD-10-CM | POA: Diagnosis not present

## 2021-02-22 DIAGNOSIS — J309 Allergic rhinitis, unspecified: Secondary | ICD-10-CM | POA: Diagnosis not present

## 2021-02-22 DIAGNOSIS — E7800 Pure hypercholesterolemia, unspecified: Secondary | ICD-10-CM | POA: Diagnosis not present

## 2021-02-22 DIAGNOSIS — Z86010 Personal history of colonic polyps: Secondary | ICD-10-CM | POA: Diagnosis not present

## 2021-02-25 DIAGNOSIS — M545 Low back pain, unspecified: Secondary | ICD-10-CM | POA: Diagnosis not present

## 2021-02-25 DIAGNOSIS — M6281 Muscle weakness (generalized): Secondary | ICD-10-CM | POA: Diagnosis not present

## 2021-02-25 DIAGNOSIS — M5432 Sciatica, left side: Secondary | ICD-10-CM | POA: Diagnosis not present

## 2021-05-27 DIAGNOSIS — Z1211 Encounter for screening for malignant neoplasm of colon: Secondary | ICD-10-CM | POA: Diagnosis not present

## 2021-05-27 DIAGNOSIS — Z86010 Personal history of colonic polyps: Secondary | ICD-10-CM | POA: Diagnosis not present

## 2021-06-07 DIAGNOSIS — J309 Allergic rhinitis, unspecified: Secondary | ICD-10-CM | POA: Diagnosis not present

## 2021-06-07 DIAGNOSIS — E7800 Pure hypercholesterolemia, unspecified: Secondary | ICD-10-CM | POA: Diagnosis not present

## 2021-06-07 DIAGNOSIS — Z125 Encounter for screening for malignant neoplasm of prostate: Secondary | ICD-10-CM | POA: Diagnosis not present

## 2021-06-07 DIAGNOSIS — M545 Low back pain, unspecified: Secondary | ICD-10-CM | POA: Diagnosis not present

## 2021-06-07 DIAGNOSIS — Z6825 Body mass index (BMI) 25.0-25.9, adult: Secondary | ICD-10-CM | POA: Diagnosis not present

## 2021-06-20 ENCOUNTER — Encounter: Payer: Self-pay | Admitting: Gastroenterology

## 2021-08-05 DIAGNOSIS — Z6825 Body mass index (BMI) 25.0-25.9, adult: Secondary | ICD-10-CM | POA: Diagnosis not present

## 2021-08-05 DIAGNOSIS — T50905A Adverse effect of unspecified drugs, medicaments and biological substances, initial encounter: Secondary | ICD-10-CM | POA: Diagnosis not present

## 2021-08-24 DIAGNOSIS — H524 Presbyopia: Secondary | ICD-10-CM | POA: Diagnosis not present

## 2021-09-14 DIAGNOSIS — Z23 Encounter for immunization: Secondary | ICD-10-CM | POA: Diagnosis not present

## 2021-12-14 DIAGNOSIS — J309 Allergic rhinitis, unspecified: Secondary | ICD-10-CM | POA: Diagnosis not present

## 2021-12-14 DIAGNOSIS — E7800 Pure hypercholesterolemia, unspecified: Secondary | ICD-10-CM | POA: Diagnosis not present

## 2021-12-14 DIAGNOSIS — Z6825 Body mass index (BMI) 25.0-25.9, adult: Secondary | ICD-10-CM | POA: Diagnosis not present

## 2021-12-14 DIAGNOSIS — K219 Gastro-esophageal reflux disease without esophagitis: Secondary | ICD-10-CM | POA: Diagnosis not present

## 2021-12-14 DIAGNOSIS — M545 Low back pain, unspecified: Secondary | ICD-10-CM | POA: Diagnosis not present

## 2021-12-19 DIAGNOSIS — Z0000 Encounter for general adult medical examination without abnormal findings: Secondary | ICD-10-CM | POA: Diagnosis not present

## 2021-12-19 DIAGNOSIS — Z1331 Encounter for screening for depression: Secondary | ICD-10-CM | POA: Diagnosis not present

## 2021-12-19 DIAGNOSIS — E785 Hyperlipidemia, unspecified: Secondary | ICD-10-CM | POA: Diagnosis not present

## 2021-12-19 DIAGNOSIS — Z9181 History of falling: Secondary | ICD-10-CM | POA: Diagnosis not present

## 2022-03-07 DIAGNOSIS — Z0289 Encounter for other administrative examinations: Secondary | ICD-10-CM | POA: Insufficient documentation

## 2022-03-07 DIAGNOSIS — G8929 Other chronic pain: Secondary | ICD-10-CM | POA: Diagnosis not present

## 2022-03-07 DIAGNOSIS — M961 Postlaminectomy syndrome, not elsewhere classified: Secondary | ICD-10-CM | POA: Diagnosis not present

## 2022-03-07 DIAGNOSIS — Z0189 Encounter for other specified special examinations: Secondary | ICD-10-CM | POA: Diagnosis not present

## 2022-03-07 DIAGNOSIS — M5416 Radiculopathy, lumbar region: Secondary | ICD-10-CM | POA: Diagnosis not present

## 2022-03-21 DIAGNOSIS — M47816 Spondylosis without myelopathy or radiculopathy, lumbar region: Secondary | ICD-10-CM | POA: Diagnosis not present

## 2022-03-21 DIAGNOSIS — G8929 Other chronic pain: Secondary | ICD-10-CM | POA: Diagnosis not present

## 2022-03-21 DIAGNOSIS — M5136 Other intervertebral disc degeneration, lumbar region: Secondary | ICD-10-CM | POA: Diagnosis not present

## 2022-03-21 DIAGNOSIS — M961 Postlaminectomy syndrome, not elsewhere classified: Secondary | ICD-10-CM | POA: Diagnosis not present

## 2022-03-21 DIAGNOSIS — M549 Dorsalgia, unspecified: Secondary | ICD-10-CM | POA: Diagnosis not present

## 2022-04-04 DIAGNOSIS — M961 Postlaminectomy syndrome, not elsewhere classified: Secondary | ICD-10-CM | POA: Diagnosis not present

## 2022-04-04 DIAGNOSIS — Z79899 Other long term (current) drug therapy: Secondary | ICD-10-CM | POA: Diagnosis not present

## 2022-04-04 DIAGNOSIS — Z6825 Body mass index (BMI) 25.0-25.9, adult: Secondary | ICD-10-CM | POA: Insufficient documentation

## 2022-04-04 DIAGNOSIS — Z79891 Long term (current) use of opiate analgesic: Secondary | ICD-10-CM | POA: Diagnosis not present

## 2022-04-04 DIAGNOSIS — G8929 Other chronic pain: Secondary | ICD-10-CM | POA: Diagnosis not present

## 2022-04-04 DIAGNOSIS — M5416 Radiculopathy, lumbar region: Secondary | ICD-10-CM | POA: Diagnosis not present

## 2022-04-04 DIAGNOSIS — Z760 Encounter for issue of repeat prescription: Secondary | ICD-10-CM | POA: Diagnosis not present

## 2022-04-04 DIAGNOSIS — E663 Overweight: Secondary | ICD-10-CM | POA: Diagnosis not present

## 2022-06-19 DIAGNOSIS — E7800 Pure hypercholesterolemia, unspecified: Secondary | ICD-10-CM | POA: Diagnosis not present

## 2022-06-19 DIAGNOSIS — M545 Low back pain, unspecified: Secondary | ICD-10-CM | POA: Diagnosis not present

## 2022-06-19 DIAGNOSIS — N400 Enlarged prostate without lower urinary tract symptoms: Secondary | ICD-10-CM | POA: Diagnosis not present

## 2022-06-19 DIAGNOSIS — M25552 Pain in left hip: Secondary | ICD-10-CM | POA: Diagnosis not present

## 2022-06-21 DIAGNOSIS — N400 Enlarged prostate without lower urinary tract symptoms: Secondary | ICD-10-CM | POA: Diagnosis not present

## 2022-06-21 DIAGNOSIS — E7800 Pure hypercholesterolemia, unspecified: Secondary | ICD-10-CM | POA: Diagnosis not present

## 2022-08-03 DIAGNOSIS — K219 Gastro-esophageal reflux disease without esophagitis: Secondary | ICD-10-CM | POA: Diagnosis not present

## 2022-08-03 DIAGNOSIS — M545 Low back pain, unspecified: Secondary | ICD-10-CM | POA: Diagnosis not present

## 2022-08-03 DIAGNOSIS — J309 Allergic rhinitis, unspecified: Secondary | ICD-10-CM | POA: Diagnosis not present

## 2022-08-03 DIAGNOSIS — Z79899 Other long term (current) drug therapy: Secondary | ICD-10-CM | POA: Diagnosis not present

## 2022-08-03 DIAGNOSIS — Z791 Long term (current) use of non-steroidal anti-inflammatories (NSAID): Secondary | ICD-10-CM | POA: Diagnosis not present

## 2022-08-03 DIAGNOSIS — M2550 Pain in unspecified joint: Secondary | ICD-10-CM | POA: Diagnosis not present

## 2022-12-12 DIAGNOSIS — H524 Presbyopia: Secondary | ICD-10-CM | POA: Diagnosis not present

## 2022-12-13 DIAGNOSIS — Z0100 Encounter for examination of eyes and vision without abnormal findings: Secondary | ICD-10-CM | POA: Diagnosis not present

## 2022-12-27 DIAGNOSIS — Z0183 Encounter for blood typing: Secondary | ICD-10-CM | POA: Diagnosis not present

## 2022-12-27 DIAGNOSIS — K219 Gastro-esophageal reflux disease without esophagitis: Secondary | ICD-10-CM | POA: Diagnosis not present

## 2022-12-27 DIAGNOSIS — E7800 Pure hypercholesterolemia, unspecified: Secondary | ICD-10-CM | POA: Diagnosis not present

## 2022-12-27 DIAGNOSIS — Z9181 History of falling: Secondary | ICD-10-CM | POA: Diagnosis not present

## 2022-12-27 DIAGNOSIS — J309 Allergic rhinitis, unspecified: Secondary | ICD-10-CM | POA: Diagnosis not present

## 2022-12-27 DIAGNOSIS — M545 Low back pain, unspecified: Secondary | ICD-10-CM | POA: Diagnosis not present

## 2023-02-15 DIAGNOSIS — R062 Wheezing: Secondary | ICD-10-CM | POA: Diagnosis not present

## 2023-02-15 DIAGNOSIS — J069 Acute upper respiratory infection, unspecified: Secondary | ICD-10-CM | POA: Diagnosis not present

## 2023-02-15 DIAGNOSIS — R6889 Other general symptoms and signs: Secondary | ICD-10-CM | POA: Diagnosis not present

## 2023-02-15 DIAGNOSIS — Z20822 Contact with and (suspected) exposure to covid-19: Secondary | ICD-10-CM | POA: Diagnosis not present
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
## 2023-02-26 ENCOUNTER — Encounter: Payer: Self-pay | Admitting: Pain Medicine

## 2023-02-26 ENCOUNTER — Ambulatory Visit
Admission: RE | Admit: 2023-02-26 | Discharge: 2023-02-26 | Disposition: A | Discharge status: Home / Self Care | Payer: Medicare HMO | Source: Ambulatory Visit | Attending: Pain Medicine | Admitting: Pain Medicine

## 2023-02-26 ENCOUNTER — Ambulatory Visit (HOSPITAL_BASED_OUTPATIENT_CLINIC_OR_DEPARTMENT_OTHER): Payer: Medicare HMO | Admitting: Pain Medicine

## 2023-02-26 VITALS — BP 124/85 | HR 74 | Temp 97.4°F | Resp 18 | Ht 67.0 in | Wt 158.0 lb

## 2023-02-26 DIAGNOSIS — M533 Sacrococcygeal disorders, not elsewhere classified: Secondary | ICD-10-CM | POA: Diagnosis not present

## 2023-02-26 DIAGNOSIS — M545 Low back pain, unspecified: Secondary | ICD-10-CM

## 2023-02-26 DIAGNOSIS — M79605 Pain in left leg: Secondary | ICD-10-CM | POA: Diagnosis not present

## 2023-02-26 DIAGNOSIS — M5137 Other intervertebral disc degeneration, lumbosacral region: Secondary | ICD-10-CM

## 2023-02-26 DIAGNOSIS — G894 Chronic pain syndrome: Secondary | ICD-10-CM

## 2023-02-26 DIAGNOSIS — M5430 Sciatica, unspecified side: Secondary | ICD-10-CM | POA: Diagnosis not present

## 2023-02-26 DIAGNOSIS — Z981 Arthrodesis status: Secondary | ICD-10-CM

## 2023-02-26 DIAGNOSIS — M25552 Pain in left hip: Secondary | ICD-10-CM | POA: Insufficient documentation

## 2023-02-26 DIAGNOSIS — M899 Disorder of bone, unspecified: Secondary | ICD-10-CM

## 2023-02-26 DIAGNOSIS — Z789 Other specified health status: Secondary | ICD-10-CM | POA: Insufficient documentation

## 2023-02-26 DIAGNOSIS — G8929 Other chronic pain: Secondary | ICD-10-CM

## 2023-02-26 DIAGNOSIS — Z79899 Other long term (current) drug therapy: Secondary | ICD-10-CM | POA: Diagnosis not present

## 2023-02-26 DIAGNOSIS — M961 Postlaminectomy syndrome, not elsewhere classified: Secondary | ICD-10-CM

## 2023-02-26 DIAGNOSIS — R700 Elevated erythrocyte sedimentation rate: Secondary | ICD-10-CM | POA: Diagnosis not present

## 2023-02-26 DIAGNOSIS — M5030 Other cervical disc degeneration, unspecified cervical region: Secondary | ICD-10-CM

## 2023-02-26 DIAGNOSIS — R748 Abnormal levels of other serum enzymes: Secondary | ICD-10-CM | POA: Diagnosis not present

## 2023-02-26 NOTE — Patient Instructions (Signed)
____________________________________________________________________________________________  New Patients  Welcome to [facility].   Initial Visit The first or initial visit consists of an evaluation only.   Interventional pain management.  We offer therapies other than opioid controlled substances to manage chronic pain. These include, but are not limited to, diagnostic, therapeutic, and palliative specialized injection therapies (i.e.: Epidural Steroids, Facet Blocks, etc.). We specialize in a variety of nerve blocks as well as radiofrequency treatments. We offer pain implant evaluations and trials, as well as follow up management. In addition we also provide a variety joint injections, including Viscosupplementation (AKA: Gel Therapy).  Prescription Pain Medication. We specialize in alternatives to opioids. We can provide evaluations and recommendations for/of pharmacologic therapies based on CDC Guidelines.  We no longer take patients for long-term medication management. We will not be taking over your pain medications.  ____________________________________________________________________________________________    ____________________________________________________________________________________________  Patient Information update  To: All of our patients.  Re: Name change.  It has been made official that our current name, "[facility]"   will soon be changed to "[facility]".   The purpose of this change is to eliminate any confusion created by the concept of our practice being a "Medication Management Pain Clinic". In the past this has led to the misconception that we treat pain primarily by the use of prescription medications.  Nothing can be farther from the truth.   Understanding PAIN MANAGEMENT: To  further understand what our practice does, you first have to understand that "Pain Management" is a subspecialty that requires additional training once a physician has completed their specialty training, which can be in either Anesthesia, Neurology, Psychiatry, or Physical Medicine and Rehabilitation (PMR). Each one of these contributes to the final approach taken by each physician to the management of their patient's pain. To be a "Pain Management Specialist" you must have first completed one of the specialty trainings below.  Anesthesiologists - trained in clinical pharmacology and interventional techniques such as nerve blockade and regional as well as central neuroanatomy. They are trained to block pain before, during, and after surgical interventions.  Neurologists - trained in the diagnosis and pharmacological treatment of complex neurological conditions, such as Multiple Sclerosis, Parkinson's, spinal cord injuries, and other systemic conditions that may be associated with symptoms that may include but are not limited to pain. They tend to rely primarily on the treatment of chronic pain using prescription medications.  Psychiatrist - trained in conditions affecting the psychosocial wellbeing of patients including but not limited to depression, anxiety, schizophrenia, personality disorders, addiction, and other substance use disorders that may be associated with chronic pain. They tend to rely primarily on the treatment of chronic pain using prescription medications.   Physical Medicine and Rehabilitation (PMR) physicians, also known as physiatrists - trained to treat a wide variety of medical conditions affecting the brain, spinal cord, nerves, bones, joints, ligaments, muscles, and tendons. Their training is primarily aimed at treating patients that have suffered injuries that have caused severe physical impairment. Their training is primarily aimed at the physical therapy and rehabilitation of those  patients. They may also work alongside orthopedic surgeons or neurosurgeons using their expertise in assisting surgical patients to recover after their surgeries.  INTERVENTIONAL PAIN MANAGEMENT is sub-subspecialty of Pain Management.  Our physicians are Board-certified in Anesthesia, Pain Management, and Interventional Pain Management.  This meaning that not only have they been trained   and Board-certified in their specialty of Anesthesia, and subspecialty of Pain Management, but they have also received further training in the sub-subspecialty of Interventional Pain Management, in order to become Board-certified as INTERVENTIONAL PAIN MANAGEMENT SPECIALIST.    Mission: Our goal is to use our skills in  INTERVENTIONAL PAIN MANAGEMENT as alternatives to the chronic use of prescription opioid medications for the treatment of pain. To make this more clear, we have changed our name to reflect what we do and offer. We will continue to offer medication management assessment and recommendations, but we will not be taking over any patient's medication management.  ____________________________________________________________________________________________     

## 2023-02-26 NOTE — Progress Notes (Deleted)
Safety precautions to be maintained throughout the outpatient stay will include: orient to surroundings, keep bed in low position, maintain call bell within reach at all times, provide assistance with transfer out of bed and ambulation.  

## 2023-02-26 NOTE — Progress Notes (Signed)
Safety precautions to be maintained throughout the outpatient stay will include: orient to surroundings, keep bed in low position, maintain call bell within reach at all times, provide assistance with transfer out of bed and ambulation.  

## 2023-02-27 LAB — COMP. METABOLIC PANEL (12)
Albumin/Globulin Ratio: 1.5 (ref 1.2–2.2)
Alkaline Phosphatase: 74 IU/L (ref 44–121)
BUN/Creatinine Ratio: 20 (ref 10–24)
Calcium: 9.3 mg/dL (ref 8.6–10.2)
Chloride: 103 mmol/L (ref 96–106)
Creatinine, Ser: 0.91 mg/dL (ref 0.76–1.27)
Globulin, Total: 2.7 g/dL (ref 1.5–4.5)

## 2023-02-27 LAB — 25-HYDROXY VITAMIN D LCMS D2+D3

## 2023-03-01 LAB — COMPLIANCE DRUG ANALYSIS, UR

## 2023-03-02 LAB — COMP. METABOLIC PANEL (12)
AST: 24 IU/L (ref 0–40)
Albumin: 4.1 g/dL (ref 3.9–4.9)
BUN: 18 mg/dL (ref 8–27)
Bilirubin Total: 0.3 mg/dL (ref 0.0–1.2)
Glucose: 91 mg/dL (ref 70–99)
Potassium: 4.7 mmol/L (ref 3.5–5.2)
Sodium: 140 mmol/L (ref 134–144)
Total Protein: 6.8 g/dL (ref 6.0–8.5)
eGFR: 94 mL/min/{1.73_m2} (ref >59)

## 2023-03-02 LAB — MAGNESIUM: Magnesium: 2.2 mg/dL (ref 1.6–2.3)

## 2023-03-02 LAB — SEDIMENTATION RATE: Sed Rate: 37 mm/hr — ABNORMAL HIGH (ref 0–30)

## 2023-03-02 LAB — C-REACTIVE PROTEIN: CRP: 3 mg/L (ref 0–10)

## 2023-03-02 LAB — VITAMIN B12: Vitamin B-12: 1503 pg/mL — ABNORMAL HIGH (ref 232–1245)

## 2023-03-13 DIAGNOSIS — R700 Elevated erythrocyte sedimentation rate: Secondary | ICD-10-CM | POA: Insufficient documentation

## 2023-03-13 DIAGNOSIS — R748 Abnormal levels of other serum enzymes: Secondary | ICD-10-CM | POA: Insufficient documentation

## 2023-03-13 DIAGNOSIS — M160 Bilateral primary osteoarthritis of hip: Secondary | ICD-10-CM | POA: Insufficient documentation

## 2023-03-13 DIAGNOSIS — M5459 Other low back pain: Secondary | ICD-10-CM | POA: Insufficient documentation

## 2023-03-13 DIAGNOSIS — M4316 Spondylolisthesis, lumbar region: Secondary | ICD-10-CM | POA: Insufficient documentation

## 2023-03-13 NOTE — Progress Notes (Unsigned)
PROVIDER NOTE: Information contained herein reflects review and annotations entered in association with encounter. Interpretation of such information and data should be left to medically-trained personnel. Information provided to patient can be located elsewhere in the medical record under "Patient Instructions". Document created using STT-dictation technology, any transcriptional errors that may result from process are unintentional.    Patient: Donald Huff  Service Category: E/M  Provider: Oswaldo Done, MD  DOB: 31-Jan-1956  DOS: 03/14/2023  Referring Provider: No ref. provider found  MRN: [id number]  Specialty: Interventional Pain Management  PCP: Practice, [facility]  Type: Established Patient  Setting: Ambulatory outpatient    Location: Office  Delivery: Face-to-face     Primary Reason(s) for Visit: Encounter for evaluation before starting new chronic pain management plan of care (Level of risk: moderate) CC: No chief complaint on file.  HPI  Donald Huff is a 67 y.o. year old, male patient, who comes today for a follow-up evaluation to review the test results and decide on a treatment plan. He has BMI 25.0-25.9,adult; Chronic pain syndrome; Chronic, continuous use of opioids; Depression; Pain medication agreement; Postlaminectomy syndrome, lumbar region; Lumbar radiculopathy; Radiculopathy of leg; Pharmacologic therapy; Disorder of skeletal system; Problems influencing health status; Failed back surgical syndrome x2; History of lumbar fusion (posterior L4-S1); DDD (degenerative disc disease), cervical; DDD (degenerative disc disease), lumbosacral; Chronic low back pain (1ry area of Pain) (Left) w/o sciatica; Chronic hip pain (2ry area of Pain) (Left); Chronic lower extremity pain (3ry area of Pain) (Left); Chronic sacroiliac joint pain (Left); Osteoarthritis of hips (Bilateral); Grade 1 Anterolisthesis of lumbar spine (L3/L4); Elevated sed rate; Elevated vitamin B12 level; and  Lumbar facet joint pain on their problem list. His primarily concern today is the No chief complaint on file.  Pain Assessment: Location:     Radiating:   Onset:   Duration:   Quality:   Severity:  /10 (subjective, self-reported pain score)  Effect on ADL:   Timing:   Modifying factors:   BP:    HR:    Donald Huff comes in today for a follow-up visit after his initial evaluation on 02/26/2023. Today we went over the results of his tests. These were explained in "Layman's terms". During today's appointment we went over my diagnostic impression, as well as the proposed treatment plan.  ***  Patient presented with interventional treatment options. Donald Huff was informed that I will not be providing medication management. Pharmacotherapy evaluation including recommendations may be offered, if specifically requested.   Controlled Substance Pharmacotherapy Assessment REMS (Risk Evaluation and Mitigation Strategy)  Opioid Analgesic: No chronic opioid analgesics therapy prescribed by our practice. None MME/day: 0 mg/day  Pill Count: None expected due to no prior prescriptions written by our practice. No notes on file Pharmacokinetics: Liberation and absorption (onset of action): WNL Distribution (time to peak effect): WNL Metabolism and excretion (duration of action): WNL         Pharmacodynamics: Desired effects: Analgesia: Donald Huff reports >50% benefit. Functional ability: Patient reports that medication allows him to accomplish basic ADLs Clinically meaningful improvement in function (CMIF): Sustained CMIF goals met Perceived effectiveness: Described as relatively effective, allowing for increase in activities of daily living (ADL) Undesirable effects: Side-effects or Adverse reactions: None reported Monitoring: [place] PMP: PDMP reviewed during this encounter. Online review of the past 38-month period previously conducted. Not applicable at this point since we have not taken over the  patient's medication management yet. List of other Serum/Urine Drug Screening Test(s):  No results found for: "AMPHSCRSER", "BARBSCRSER", "BENZOSCRSER", "COCAINSCRSER", "COCAINSCRNUR", "PCPSCRSER", "THCSCRSER", "THCU", "CANNABQUANT", "OPIATESCRSER", "OXYSCRSER", "PROPOXSCRSER", "ETH", "CBDTHCR", "D8THCCBX", "D9THCCBX" List of all UDS test(s) done:  Lab Results  Component Value Date   SUMMARY Note 02/26/2023   Last UDS on record: Summary  Date Value Ref Range Status  02/26/2023 Note  Final    Comment:    ==================================================================== Compliance Drug Analysis, Ur ==================================================================== Test                             Result       Flag       Units  Drug Present and Declared for Prescription Verification   Gabapentin                     PRESENT      EXPECTED  Drug Absent but Declared for Prescription Verification   Tizanidine                     Not Detected UNEXPECTED    Tizanidine, as indicated in the declared medication list, is not    always detected even when used as directed.    Acetaminophen                  Not Detected UNEXPECTED    Acetaminophen, as indicated in the declared medication list, is not    always detected even when used as directed.    Ibuprofen                      Not Detected UNEXPECTED    Ibuprofen, as indicated in the declared medication list, is not    always detected even when used as directed.  ==================================================================== Test                      Result    Flag   Units      Ref Range   Creatinine              27               mg/dL      >=57 ==================================================================== Declared Medications:  The flagging and interpretation on this report are based on the  following declared medications.  Unexpected results may arise from  inaccuracies in the declared medications.   **Note: The  testing scope of this panel includes these medications:   Gabapentin (Neurontin)   **Note: The testing scope of this panel does not include small to  moderate amounts of these reported medications:   Acetaminophen  Ibuprofen (Advil)  Tizanidine (Zanaflex)   **Note: The testing scope of this panel does not include the  following reported medications:   Desloratadine (Clarinex)  Fluticasone (Flonase)  Omega-3 Fatty Acids  Omeprazole (Prilosec) ==================================================================== For clinical consultation, please call [phone]. ====================================================================    UDS interpretation: No unexpected findings.          Medication Assessment Form: Not applicable. No opioids. Treatment compliance: Not applicable Risk Assessment Profile: Aberrant behavior: See initial evaluations. None observed or detected today Comorbid factors increasing risk of overdose: See initial evaluation. No additional risks detected today Opioid risk tool (ORT):     02/26/2023    9:21 AM  Opioid Risk   Alcohol 0  Illegal Drugs 0  Rx Drugs 0  Alcohol 0  Illegal Drugs 0  Rx Drugs 0  Age between 4-45  years  0  Psychological Disease 0  Depression 1  Opioid Risk Tool Scoring 1  Opioid Risk Interpretation Low Risk    ORT Scoring interpretation table:  Score <3 = Low Risk for SUD  Score between 4-7 = Moderate Risk for SUD  Score >8 = High Risk for Opioid Abuse   Risk of substance use disorder (SUD): Low  Risk Mitigation Strategies:  Patient opioid safety counseling: No controlled substances prescribed. Patient-Prescriber Agreement (PPA): No agreement signed.  Controlled substance notification to other providers: None required. No opioid therapy.  Pharmacologic Plan: Non-opioid analgesic therapy offered. Interventional alternatives discussed.             Laboratory Chemistry Profile   Renal Lab Results  Component Value  Date   BUN 18 02/26/2023   CREATININE 0.91 02/26/2023   BCR 20 02/26/2023     Electrolytes Lab Results  Component Value Date   NA 140 02/26/2023   K 4.7 02/26/2023   CL 103 02/26/2023   CALCIUM 9.3 02/26/2023   MG 2.2 02/26/2023     Hepatic Lab Results  Component Value Date   AST 24 02/26/2023   ALBUMIN 4.1 02/26/2023   ALKPHOS 74 02/26/2023     ID No results found for: "LYMEIGGIGMAB", "HIV", "SARSCOV2NAA", "STAPHAUREUS", "MRSAPCR", "HCVAB", "PREGTESTUR", "RMSFIGG", "QFVRPH1IGG", "QFVRPH2IGG"   Bone Lab Results  Component Value Date   25OHVITD1 44 02/26/2023   25OHVITD2 <1.0 02/26/2023   25OHVITD3 43 02/26/2023     Endocrine Lab Results  Component Value Date   GLUCOSE 91 02/26/2023     Neuropathy Lab Results  Component Value Date   VITAMINB12 1,503 (H) 02/26/2023     CNS No results found for: "COLORCSF", "APPEARCSF", "RBCCOUNTCSF", "WBCCSF", "POLYSCSF", "LYMPHSCSF", "EOSCSF", "PROTEINCSF", "GLUCCSF", "JCVIRUS", "CSFOLI", "IGGCSF", "LABACHR", "ACETBL"   Inflammation (CRP: Acute  ESR: Chronic) Lab Results  Component Value Date   CRP 3 02/26/2023   ESRSEDRATE 37 (H) 02/26/2023     Rheumatology No results found for: "RF", "ANA", "LABURIC", "URICUR", "LYMEIGGIGMAB", "LYMEABIGMQN", "HLAB27"   Coagulation No results found for: "INR", "LABPROT", "APTT", "PLT", "DDIMER", "LABHEMA", "VITAMINK1", "AT3"   Cardiovascular No results found for: "BNP", "CKTOTAL", "CKMB", "TROPONINI", "HGB", "HCT", "LABVMA", "EPIRU", "EPINEPH24HUR", "NOREPRU", "NOREPI24HUR", "DOPARU", "DOPAM24HRUR"   Screening No results found for: "SARSCOV2NAA", "COVIDSOURCE", "STAPHAUREUS", "MRSAPCR", "HCVAB", "HIV", "PREGTESTUR"   Cancer No results found for: "CEA", "CA125", "LABCA2"   Allergens No results found for: "ALMOND", "APPLE", "ASPARAGUS", "AVOCADO", "BANANA", "BARLEY", "BASIL", "BAYLEAF", "GREENBEAN", "LIMABEAN", "WHITEBEAN", "BEEFIGE", "REDBEET", "BLUEBERRY", "BROCCOLI", "CABBAGE",  "MELON", "CARROT", "CASEIN", "CASHEWNUT", "CAULIFLOWER", "CELERY"     Note: Lab results reviewed.  Recent Diagnostic Imaging Review  Lumbosacral Imaging: Lumbar DG Bending views: Results for orders placed during the hospital encounter of 02/26/23 DG Lumbar Spine Complete W/Bend  Narrative CLINICAL DATA:  Chronic lower back pain.  EXAM: LUMBAR SPINE - COMPLETE WITH BENDING VIEWS  COMPARISON:  None Available.  FINDINGS: There are 5 non-rib-bearing lumbar-type vertebral bodies. Postsurgical changes are seen of bilateral transpedicular rod and screw fusion hardware at L4 through S1. No perihardware lucency is seen.  Minimal dextrocurvature centered at L1.  2 mm grade 1 anterolisthesis of L3 on L4 appears not significantly changed on neutral, flexion, or extension view.  Vertebral body heights are maintained. Moderate L5-S1 disc space narrowing.  IMPRESSION: Postsurgical changes of bilateral transpedicular rod and screw fusion at L4 through S1 without evidence of hardware complication.  Minimal dextrocurvature centered at L1.  Minimal grade 1 anterolisthesis of L3 on L4, unchanged on dynamic views.  Electronically Signed By: Neita Garnet M.D. On: 02/27/2023 10:22  Sacroiliac Joint Imaging: Sacroiliac Joint DG: Results for orders placed during the hospital encounter of 02/26/23 DG Si Joints  Narrative CLINICAL DATA:  Chronic lower back pain.  Left hip pain.  EXAM: BILATERAL SACROILIAC JOINTS - 3+ VIEW; DG HIP (WITH OR WITHOUT PELVIS) 2-3V LEFT  COMPARISON:  None Available.  FINDINGS: There is diffuse decreased bone mineralization. Mild superomedial right femoroacetabular joint space narrowing. The left femoroacetabular and pubic symphysis joint spaces are maintained.  Normal morphology left femoral head-neck junction without CAM-type bump deformity.  The bilateral sacroiliac joint spaces are maintained. No subchondral sclerosis.  Partial visualization  of lumbosacral posterior fusion hardware.  No acute fracture or dislocation.  IMPRESSION: Mild right femoroacetabular osteoarthritis. No significant left femoroacetabular osteoarthritis.  Normal radiographic appearance of the bilateral sacroiliac joints.   Electronically Signed By: Neita Garnet M.D. On: 02/27/2023 10:19  Hip Imaging: Hip-L DG 2-3 views: Results for orders placed during the hospital encounter of 02/26/23 DG HIP UNILAT W OR W/O PELVIS 2-3 VIEWS LEFT  Narrative CLINICAL DATA:  Chronic lower back pain.  Left hip pain.  EXAM: BILATERAL SACROILIAC JOINTS - 3+ VIEW; DG HIP (WITH OR WITHOUT PELVIS) 2-3V LEFT  COMPARISON:  None Available.  FINDINGS: There is diffuse decreased bone mineralization. Mild superomedial right femoroacetabular joint space narrowing. The left femoroacetabular and pubic symphysis joint spaces are maintained.  Normal morphology left femoral head-neck junction without CAM-type bump deformity.  The bilateral sacroiliac joint spaces are maintained. No subchondral sclerosis.  Partial visualization of lumbosacral posterior fusion hardware.  No acute fracture or dislocation.  IMPRESSION: Mild right femoroacetabular osteoarthritis. No significant left femoroacetabular osteoarthritis.  Normal radiographic appearance of the bilateral sacroiliac joints.   Electronically Signed By: Neita Garnet M.D. On: 02/27/2023 10:19  Complexity Note: Imaging results reviewed.                         Meds   Current Outpatient Medications:    acetaminophen (TYLENOL) 500 MG tablet, Take 500 mg by mouth every 6 (six) hours as needed., Disp: , Rfl:    desloratadine (CLARINEX) 5 MG tablet, Take 1 tablet by mouth daily., Disp: , Rfl:    fluticasone (FLONASE) 50 MCG/ACT nasal spray, , Disp: , Rfl:    gabapentin (NEURONTIN) 100 MG capsule, Take 100 mg by mouth at bedtime., Disp: , Rfl:    ibuprofen (ADVIL) 800 MG tablet, Take 800 mg by mouth 3 (three)  times daily as needed., Disp: , Rfl:    Omega-3 Fatty Acids (OMEGA-3 FISH OIL PO), Take by mouth., Disp: , Rfl:    omeprazole (PRILOSEC) 40 MG capsule, Take 40 mg by mouth daily as needed., Disp: , Rfl:    tiZANidine (ZANAFLEX) 4 MG tablet, Take 2-4 mg by mouth 2 (two) times daily as needed., Disp: , Rfl:   ROS  Constitutional: Denies any fever or chills Gastrointestinal: No reported hemesis, hematochezia, vomiting, or acute GI distress Musculoskeletal: Denies any acute onset joint swelling, redness, loss of ROM, or weakness Neurological: No reported episodes of acute onset apraxia, aphasia, dysarthria, agnosia, amnesia, paralysis, loss of coordination, or loss of consciousness  Allergies  Donald Huff is allergic to oxycodone.  PFSH  Drug: Donald Huff  reports no history of drug use. Alcohol:  has no history on file for alcohol use. Tobacco:  reports that he has never smoked. He has never been exposed to tobacco smoke. He  has never used smokeless tobacco. Medical:  has no past medical history on file. Surgical: Donald Huff  has no past surgical history on file. Family: family history is not on file.  Constitutional Exam  General appearance: Well nourished, well developed, and well hydrated. In no apparent acute distress There were no vitals filed for this visit. BMI Assessment: Estimated body mass index is 24.75 kg/m as calculated from the following:   Height as of 02/26/23: 5\' 7"  (1.702 m).   Weight as of 02/26/23: 158 lb (71.7 kg).  BMI interpretation table: BMI level Category Range association with higher incidence of chronic pain  <18 kg/m2 Underweight   18.5-24.9 kg/m2 Ideal body weight   25-29.9 kg/m2 Overweight Increased incidence by 20%  30-34.9 kg/m2 Obese (Class I) Increased incidence by 68%  35-39.9 kg/m2 Severe obesity (Class II) Increased incidence by 136%  >40 kg/m2 Extreme obesity (Class III) Increased incidence by 254%   Patient's current BMI Ideal Body weight  There  is no height or weight on file to calculate BMI. Patient weight not recorded   BMI Readings from Last 4 Encounters:  02/26/23 24.75 kg/m   Wt Readings from Last 4 Encounters:  02/26/23 158 lb (71.7 kg)    Psych/Mental status: Alert, oriented x 3 (person, place, & time)       Eyes: PERLA Respiratory: No evidence of acute respiratory distress  Assessment & Plan  Primary Diagnosis & Pertinent Problem List: The primary encounter diagnosis was Chronic low back pain (1ry area of Pain) (Left) w/o sciatica. 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  Visit Diagnosis: 1. Chronic low back pain (1ry area of Pain) (Left) w/o sciatica   2. Chronic hip pain (2ry area of Pain) (Left)   3. Chronic lower extremity pain (3ry area of Pain) (Left)   4. Failed back surgical syndrome x2   5. Chronic pain syndrome   6. 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 Lumbar facet joint pain    Problems updated and reviewed during this visit: Problem  Osteoarthritis of hips (Bilateral)   Mild right femoroacetabular osteoarthritis.   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  Procedure Orders    No procedure(s) ordered today   Lab Orders  No laboratory test(s) ordered today   Imaging Orders  No imaging studies ordered today   Referral Orders  No referral(s) requested today    Pharmacological management:  Opioid Analgesics: I will not be prescribing any opioids at this time Membrane stabilizer: I will not be prescribing any at this time Muscle relaxant:  I will not be prescribing any at this time NSAID: I will not be prescribing any at this time Other analgesic(s): I will not be prescribing any at this time      Interventional Therapies  Risk Factors  Considerations:     Planned  Pending:      Under consideration:   Diagnostic/therapeutic left lumbar facet MBB #1    Completed:   None at this time   Completed by other providers:   None reported  Therapeutic  Palliative (PRN) options:   None established      Provider-requested follow-up: No follow-ups on file. Recent Visits Date Type Provider Dept  02/26/23 Office Visit Delano Metz, MD [facility]  Showing recent visits within past 90 days and meeting all other requirements Future Appointments Date Type Provider Dept  03/14/23 Appointment Delano Metz, MD [facility]  Showing future appointments within next 90 days and meeting all other requirements   Primary Care Physician: Practice, [facility]  Duration of encounter: *** minutes.  Total time on encounter, as per AMA guidelines included both the face-to-face and non-face-to-face time personally spent by the physician and/or other qualified health care professional(s) on the day of the encounter (includes time in activities that require the physician or other qualified health care professional and does not include time in activities normally performed by clinical staff). Physician's time may include the following activities when performed: Preparing to see the patient (e.g., pre-charting review of records, searching for previously ordered imaging, lab work, and nerve conduction tests) Review of prior analgesic pharmacotherapies. Reviewing PMP Interpreting ordered tests (e.g., lab work, imaging, nerve conduction tests) Performing post-procedure evaluations, including interpretation of diagnostic procedures Obtaining and/or reviewing separately obtained history Performing a  medically appropriate examination and/or evaluation Counseling and educating the patient/family/caregiver Ordering medications, tests, or procedures Referring and communicating with other health care professionals (when not separately reported) Documenting clinical information in the electronic or other health record Independently interpreting results (not separately reported) and communicating results to the patient/ family/caregiver Care coordination (not separately reported)  Note by: Oswaldo Done, MD (TTS technology used. I apologize for any typographical errors that were not detected and corrected.) Date: 03/14/2023; Time: 4:00 PM

## 2023-03-14 ENCOUNTER — Ambulatory Visit: Payer: Medicare HMO | Attending: Pain Medicine | Admitting: Pain Medicine

## 2023-03-14 ENCOUNTER — Encounter: Payer: Self-pay | Admitting: Pain Medicine

## 2023-03-14 VITALS — BP 108/72 | HR 72 | Temp 97.4°F | Resp 18 | Ht 67.0 in | Wt 157.0 lb

## 2023-03-14 DIAGNOSIS — G894 Chronic pain syndrome: Secondary | ICD-10-CM | POA: Insufficient documentation

## 2023-03-14 DIAGNOSIS — M961 Postlaminectomy syndrome, not elsewhere classified: Secondary | ICD-10-CM | POA: Insufficient documentation

## 2023-03-14 DIAGNOSIS — M5459 Other low back pain: Secondary | ICD-10-CM | POA: Diagnosis not present

## 2023-03-14 DIAGNOSIS — M25552 Pain in left hip: Secondary | ICD-10-CM | POA: Diagnosis not present

## 2023-03-14 DIAGNOSIS — M79605 Pain in left leg: Secondary | ICD-10-CM | POA: Insufficient documentation

## 2023-03-14 DIAGNOSIS — G8929 Other chronic pain: Secondary | ICD-10-CM | POA: Diagnosis not present

## 2023-03-14 DIAGNOSIS — M4316 Spondylolisthesis, lumbar region: Secondary | ICD-10-CM | POA: Diagnosis not present

## 2023-03-14 DIAGNOSIS — M545 Low back pain, unspecified: Secondary | ICD-10-CM | POA: Insufficient documentation

## 2023-03-14 NOTE — Patient Instructions (Addendum)
Facet Blocks Patient Information  Description: The facets are joints in the spine between the vertebrae.  Like any joints in the body, facets can become irritated and painful.  Arthritis can also effect the facets.  By injecting steroids and local anesthetic in and around these joints, we can temporarily block the nerve supply to them.  Steroids act directly on irritated nerves and tissues to reduce selling and inflammation which often leads to decreased pain.  Facet blocks may be done anywhere along the spine from the neck to the low back depending upon the location of your pain.   After numbing the skin with local anesthetic (like Novocaine), a small needle is passed onto the facet joints under x-ray guidance.  You may experience a sensation of pressure while this is being done.  The entire block usually lasts about 15-25 minutes.   Conditions which may be treated by facet blocks:  Low back/buttock pain Neck/shoulder pain Certain types of headaches  Preparation for the injection:  Do not eat any solid food or dairy products within 8 hours of your appointment. You may drink clear liquid up to 3 hours before appointment.  Clear liquids include water, black coffee, juice or soda.  No milk or cream please. You may take your regular medication, including pain medications, with a sip of water before your appointment.  Diabetics should hold regular insulin (if taken separately) and take 1/2 normal NPH dose the morning of the procedure.  Carry some sugar containing items with you to your appointment. A driver must accompany you and be prepared to drive you home after your procedure. Bring all your current medications with you. An IV may be inserted and sedation may be given at the discretion of the physician. A blood pressure cuff, EKG and other monitors will often be applied during the procedure.  Some patients may need to have extra oxygen administered for a short period. You will be asked to  provide medical information, including your allergies and medications, prior to the procedure.  We must know immediately if you are taking blood thinners (like Coumadin/Warfarin) or if you are allergic to IV iodine contrast (dye).  We must know if you could possible be pregnant.  Possible side-effects:  Bleeding from needle site Infection (rare, may require surgery) Nerve injury (rare) Numbness & tingling (temporary) Difficulty urinating (rare, temporary) Spinal headache (a headache worse with upright posture) Light-headedness (temporary) Pain at injection site (serveral days) Decreased blood pressure (rare, temporary) Weakness in arm/leg (temporary) Pressure sensation in back/neck (temporary)   Call if you experience:  Fever/chills associated with headache or increased back/neck pain Headache worsened by an upright position New onset, weakness or numbness of an extremity below the injection site Hives or difficulty breathing (go to the emergency room) Inflammation or drainage at the injection site(s) Severe back/neck pain greater than usual New symptoms which are concerning to you  Please note:  Although the local anesthetic injected can often make your back or neck feel good for several hours after the injection, the pain will likely return. It takes 3-7 days for steroids to work.  You may not notice any pain relief for at least one week.  If effective, we will often do a series of 2-3 injections spaced 3-6 weeks apart to maximally decrease your pain.  After the initial series, you may be a candidate for a more permanent nerve block of the facets.  If you have any questions, please call [phone] [facility]   Pain Clinic ______________________________________________________________________  Procedure instructions  Do not eat or drink fluids (other than water) for 6 hours before your procedure  No water for 2 hours before your procedure  Take your  blood pressure medicine with a sip of water  Arrive 30 minutes before your appointment  Carefully read the "Preparing for your procedure" detailed instructions  If you have questions call us at [phone]  _____________________________________________________________________    ______________________________________________________________________  Preparing for your procedure  Appointments: If you think you may not be able to keep your appointment, call 24-48 hours in advance to cancel. We need time to make it available to others.  During your procedure appointment there will be: No Prescription Refills. No disability issues to discussed. No medication changes or discussions.  Instructions: Food intake: Avoid eating anything solid for at least 8 hours prior to your procedure. Clear liquid intake: You may take clear liquids such as water up to 2 hours prior to your procedure. (No carbonated drinks. No soda.) Transportation: Unless otherwise stated by your physician, bring a driver. Morning Medicines: Except for blood thinners, take all of your other morning medications with a sip of water. Make sure to take your heart and blood pressure medicines. If your blood pressure's lower number is above 100, the case will be rescheduled. Blood thinners: Make sure to stop your blood thinners as instructed.  If you take a blood thinner, but were not instructed to stop it, call our office [phone] and ask to talk to a nurse. Not stopping a blood thinner prior to certain procedures could lead to serious complications. Diabetics on insulin: Notify the staff so that you can be scheduled 1st case in the morning. If your diabetes requires high dose insulin, take only  of your normal insulin dose the morning of the procedure and notify the staff that you have done so. Preventing infections: Shower with an antibacterial soap the morning of your procedure.  Build-up your immune system: Take 1000  mg of Vitamin C with every meal (3 times a day) the day prior to your procedure. Antibiotics: Inform the nursing staff if you are taking any antibiotics or if you have any conditions that may require antibiotics prior to procedures. (Example: recent joint implants)   Pregnancy: If you are pregnant make sure to notify the nursing staff. Not doing so may result in injury to the fetus, including death.  Sickness: If you have a cold, fever, or any active infections, call and cancel or reschedule your procedure. Receiving steroids while having an infection may result in complications. Arrival: You must be in the facility at least 30 minutes prior to your scheduled procedure. Tardiness: Your scheduled time is also the cutoff time. If you do not arrive at least 15 minutes prior to your procedure, you will be rescheduled.  Children: Do not bring any children with you. Make arrangements to keep them home. Dress appropriately: There is always a possibility that your clothing may get soiled. Avoid long dresses. Valuables: Do not bring any jewelry or valuables.  Reasons to call and reschedule or cancel your procedure: (Following these recommendations will minimize the risk of a serious complication.) Surgeries: Avoid having procedures within 2 weeks of any surgery. (Avoid for 2 weeks before or after any surgery). Flu Shots: Avoid having procedures within 2 weeks of a flu shots or . (Avoid for 2 weeks before or after immunizations). Barium: Avoid having a procedure within 7-10 days after having had a radiological study involving   the use of radiological contrast. (Myelograms, Barium swallow or enema study). Heart attacks: Avoid any elective procedures or surgeries for the initial 6 months after a "Myocardial Infarction" (Heart Attack). Blood thinners: It is imperative that you stop these medications before procedures. Let us know if you if you take any blood thinner.  Infection: Avoid procedures during or within  two weeks of an infection (including chest colds or gastrointestinal problems). Symptoms associated with infections include: Localized redness, fever, chills, night sweats or profuse sweating, burning sensation when voiding, cough, congestion, stuffiness, runny nose, sore throat, diarrhea, nausea, vomiting, cold or Flu symptoms, recent or current infections. It is specially important if the infection is over the area that we intend to treat. Heart and lung problems: Symptoms that may suggest an active cardiopulmonary problem include: cough, chest pain, breathing difficulties or shortness of breath, dizziness, ankle swelling, uncontrolled high or unusually low blood pressure, and/or palpitations. If you are experiencing any of these symptoms, cancel your procedure and contact your primary care physician for an evaluation.  Remember:  Regular Business hours are:  Monday to Thursday 8:00 AM to 4:00 PM  Provider's Schedule: Francisco Naveira, MD:  Procedure days: Tuesday and Thursday 7:30 AM to 4:00 PM  Bilal Lateef, MD:  Procedure days: Monday and Wednesday 7:30 AM to 4:00 PM  ______________________________________________________________________    ____________________________________________________________________________________________  General Risks and Possible Complications  Patient Responsibilities: It is important that you read this as it is part of your informed consent. It is our duty to inform you of the risks and possible complications associated with treatments offered to you. It is your responsibility as a patient to read this and to ask questions about anything that is not clear or that you believe was not covered in this document.  Patient's Rights: You have the right to refuse treatment. You also have the right to change your mind, even after initially having agreed to have the treatment done. However, under this last option, if you wait until the last second to change your  mind, you may be charged for the materials used up to that point.  Introduction: Medicine is not an exact science. Everything in Medicine, including the lack of treatment(s), carries the potential for danger, harm, or loss (which is by definition: Risk). In Medicine, a complication is a secondary problem, condition, or disease that can aggravate an already existing one. All treatments carry the risk of possible complications. The fact that a side effects or complications occurs, does not imply that the treatment was conducted incorrectly. It must be clearly understood that these can happen even when everything is done following the highest safety standards.  No treatment: You can choose not to proceed with the proposed treatment alternative. The "PRO(s)" would include: avoiding the risk of complications associated with the therapy. The "CON(s)" would include: not getting any of the treatment benefits. These benefits fall under one of three categories: diagnostic; therapeutic; and/or palliative. Diagnostic benefits include: getting information which can ultimately lead to improvement of the disease or symptom(s). Therapeutic benefits are those associated with the successful treatment of the disease. Finally, palliative benefits are those related to the decrease of the primary symptoms, without necessarily curing the condition (example: decreasing the pain from a flare-up of a chronic condition, such as incurable terminal cancer).  General Risks and Complications: These are associated to most interventional treatments. They can occur alone, or in combination. They fall under one of the following six (6) categories: no benefit or worsening   of symptoms; bleeding; infection; nerve damage; allergic reactions; and/or death. No benefits or worsening of symptoms: In Medicine there are no guarantees, only probabilities. No healthcare provider can ever guarantee that a medical treatment will work, they can only state  the probability that it may. Furthermore, there is always the possibility that the condition may worsen, either directly, or indirectly, as a consequence of the treatment. Bleeding: This is more common if the patient is taking a blood thinner, either prescription or over the counter (example: Goody Powders, Fish oil, Aspirin, Garlic, etc.), or if suffering a condition associated with impaired coagulation (example: Hemophilia, cirrhosis of the liver, low platelet counts, etc.). However, even if you do not have one on these, it can still happen. If you have any of these conditions, or take one of these drugs, make sure to notify your treating physician. Infection: This is more common in patients with a compromised immune system, either due to disease (example: diabetes, cancer, human immunodeficiency virus [HIV], etc.), or due to medications or treatments (example: therapies used to treat cancer and rheumatological diseases). However, even if you do not have one on these, it can still happen. If you have any of these conditions, or take one of these drugs, make sure to notify your treating physician. Nerve Damage: This is more common when the treatment is an invasive one, but it can also happen with the use of medications, such as those used in the treatment of cancer. The damage can occur to small secondary nerves, or to large primary ones, such as those in the spinal cord and brain. This damage may be temporary or permanent and it may lead to impairments that can range from temporary numbness to permanent paralysis and/or brain death. Allergic Reactions: Any time a substance or material comes in contact with our body, there is the possibility of an allergic reaction. These can range from a mild skin rash (contact dermatitis) to a severe systemic reaction (anaphylactic reaction), which can result in death. Death: In general, any medical intervention can result in death, most of the time due to an unforeseen  complication. ____________________________________________________________________________________________    

## 2023-03-14 NOTE — Progress Notes (Signed)
Safety precautions to be maintained throughout the outpatient stay will include: orient to surroundings, keep bed in low position, maintain call bell within reach at all times, provide assistance with transfer out of bed and ambulation.  Interpreter Loyda in with patient. He speaks and understands [place] well. Refused int for next appt.

## 2023-04-23 ENCOUNTER — Ambulatory Visit: Payer: Medicare HMO | Attending: Pain Medicine | Admitting: Pain Medicine

## 2023-04-23 DIAGNOSIS — M5459 Other low back pain: Secondary | ICD-10-CM | POA: Diagnosis not present

## 2023-04-23 DIAGNOSIS — M545 Low back pain, unspecified: Secondary | ICD-10-CM | POA: Diagnosis not present

## 2023-04-23 DIAGNOSIS — M5137 Other intervertebral disc degeneration, lumbosacral region: Secondary | ICD-10-CM | POA: Diagnosis not present

## 2023-04-23 DIAGNOSIS — G8929 Other chronic pain: Secondary | ICD-10-CM

## 2023-04-23 DIAGNOSIS — M4316 Spondylolisthesis, lumbar region: Secondary | ICD-10-CM

## 2023-04-23 DIAGNOSIS — Z981 Arthrodesis status: Secondary | ICD-10-CM

## 2023-04-23 NOTE — Progress Notes (Signed)
Patient: Donald Huff  Service Category: E/M  Provider: Oswaldo Done, MD  DOB: 1956/03/21  DOS: 04/23/2023  Location: Office  MRN: [id number]  Setting: Ambulatory outpatient  Referring Provider: [facility], [facility]  Type: Established Patient  Specialty: Interventional Pain Management  PCP: [facility], [facility]  Location: Remote location  Delivery: TeleHealth     Virtual Encounter - Pain Management PROVIDER NOTE: Information contained herein reflects review and annotations entered in association with encounter. Interpretation of such information and data should be left to medically-trained personnel. Information provided to patient can be located elsewhere in the medical record under "Patient Instructions". Document created using STT-dictation technology, any transcriptional errors that may result from process are unintentional.    Contact & Pharmacy Preferred: [phone] Home: [phone] (home) Mobile: [phone] (mobile) E-mail: No e-mail address on record  [id number] - [place], [place] - [address] AT [facility] [address] [place] [address] Phone: [phone] Fax: [phone]   Pre-screening  Donald Huff offered "in-person" vs "virtual" encounter. He indicated preferring virtual for this encounter.   Reason COVID-19*  Social distancing based on CDC and AMA recommendations.   I contacted Donald Huff on 04/23/2023 via telephone.      I clearly identified myself as Oswaldo Done, MD. I verified that I was speaking with the correct person using two identifiers (Name: Donald Huff, and date of birth: [birth year]).  Consent I sought verbal advanced consent from Donald Huff for virtual visit interactions. I informed Donald Huff of possible security and privacy concerns, risks, and limitations associated with providing "not-in-person" medical evaluation and management services. I also informed Donald Huff of the  availability of "in-person" appointments. Finally, I informed him that there would be a charge for the virtual visit and that he could be  personally, fully or partially, financially responsible for it. Donald Huff expressed understanding and agreed to proceed.   Historic Elements   Donald Huff is a 67 y.o. year old, male patient evaluated today after our last contact on 03/14/2023. Donald Huff  has no past medical history on file. He also  has no past surgical history on file. Donald Huff has a current medication list which includes the following prescription(s): acetaminophen, desloratadine, fluticasone, gabapentin, ibuprofen, omega-3 fatty acids, omeprazole, and tizanidine. He  reports that he has never smoked. He has never been exposed to tobacco smoke. He has never used smokeless tobacco. He reports that he does not use drugs. No history on file for alcohol use. Donald Huff is allergic to oxycodone.  BMI: Estimated body mass index is 24.59 kg/m as calculated from the following:   Height as of 03/14/23: 5\' 7"  (1.702 m).   Weight as of 03/14/23: 157 lb (71.2 kg). Last encounter: 03/14/2023. Last procedure: None.  HPI  Today, he is being contacted for  questions regarding an upcoming diagnostic lumbar facet medial branch block.  The patient is scheduled for a diagnostic left lumbar facet MBB on 05/03/2023 (Thursday).  He has a prior lumbar fusion with bilateral transpedicular rod and screws from L4-S1 he also has a grade 1 (2 mm) anterolisthesis of L3 over L4 that seems to be unchanged on dynamic views (stable).   Today I spoke to the patient and he had several questions with regards to the procedure all of which were explained to him to his satisfaction.  I explained to him the role of the local anesthetics and the steroids and the diagnostic  portion of the procedure and how he was of about most importance for him to keep track of everything and report back 2 weeks later so that we can get as much  information as possible as to the location and mechanism of his pain.  Today I have answered all of his questions but the phone call lasted approximately 44 minutes.  Pharmacotherapy Assessment   Opioid Analgesic: No chronic opioid analgesics therapy prescribed by our practice. None MME/day: 0 mg/day   Monitoring: [place] PMP: PDMP reviewed during this encounter.       Pharmacotherapy: No side-effects or adverse reactions reported. Compliance: No problems identified. Effectiveness: Clinically acceptable. Plan: Refer to "POC". UDS:  Summary  Date Value Ref Range Status  02/26/2023 Note  Final    Comment:    ==================================================================== Compliance Drug Analysis, Ur ==================================================================== Test                             Result       Flag       Units  Drug Present and Declared for Prescription Verification   Gabapentin                     PRESENT      EXPECTED  Drug Absent but Declared for Prescription Verification   Tizanidine                     Not Detected UNEXPECTED    Tizanidine, as indicated in the declared medication list, is not    always detected even when used as directed.    Acetaminophen                  Not Detected UNEXPECTED    Acetaminophen, as indicated in the declared medication list, is not    always detected even when used as directed.    Ibuprofen                      Not Detected UNEXPECTED    Ibuprofen, as indicated in the declared medication list, is not    always detected even when used as directed.  ==================================================================== Test                      Result    Flag   Units      Ref Range   Creatinine              27               mg/dL      >=30 ==================================================================== Declared Medications:  The flagging and interpretation on this report are based on the  following declared  medications.  Unexpected results may arise from  inaccuracies in the declared medications.   **Note: The testing scope of this panel includes these medications:   Gabapentin (Neurontin)   **Note: The testing scope of this panel does not include small to  moderate amounts of these reported medications:   Acetaminophen  Ibuprofen (Advil)  Tizanidine (Zanaflex)   **Note: The testing scope of this panel does not include the  following reported medications:   Desloratadine (Clarinex)  Fluticasone (Flonase)  Omega-3 Fatty Acids  Omeprazole (Prilosec) ==================================================================== For clinical consultation, please call [phone]. ====================================================================    No results found for: "CBDTHCR", "D8THCCBX", "D9THCCBX"   Laboratory Chemistry Profile   Renal Lab Results  Component Value Date  BUN 18 02/26/2023   CREATININE 0.91 02/26/2023   BCR 20 02/26/2023    Hepatic Lab Results  Component Value Date   AST 24 02/26/2023   ALBUMIN 4.1 02/26/2023   ALKPHOS 74 02/26/2023    Electrolytes Lab Results  Component Value Date   NA 140 02/26/2023   K 4.7 02/26/2023   CL 103 02/26/2023   CALCIUM 9.3 02/26/2023   MG 2.2 02/26/2023    Bone Lab Results  Component Value Date   25OHVITD1 44 02/26/2023   25OHVITD2 <1.0 02/26/2023   25OHVITD3 43 02/26/2023    Inflammation (CRP: Acute Phase) (ESR: Chronic Phase) Lab Results  Component Value Date   CRP 3 02/26/2023   ESRSEDRATE 37 (H) 02/26/2023         Note: Above Lab results reviewed.  Imaging  DG Lumbar Spine Complete W/Bend CLINICAL DATA:  Chronic lower back pain.  EXAM: LUMBAR SPINE - COMPLETE WITH BENDING VIEWS  COMPARISON:  None Available.  FINDINGS: There are 5 non-rib-bearing lumbar-type vertebral bodies. Postsurgical changes are seen of bilateral transpedicular rod and screw fusion hardware at L4 through S1. No  perihardware lucency is seen.  Minimal dextrocurvature centered at L1.  2 mm grade 1 anterolisthesis of L3 on L4 appears not significantly changed on neutral, flexion, or extension view.  Vertebral body heights are maintained. Moderate L5-S1 disc space narrowing.  IMPRESSION: Postsurgical changes of bilateral transpedicular rod and screw fusion at L4 through S1 without evidence of hardware complication.  Minimal dextrocurvature centered at L1.  Minimal grade 1 anterolisthesis of L3 on L4, unchanged on dynamic views.  Electronically Signed   By: Neita Garnet M.D.   On: 02/27/2023 10:22 DG Si Joints CLINICAL DATA:  Chronic lower back pain.  Left hip pain.  EXAM: BILATERAL SACROILIAC JOINTS - 3+ VIEW; DG HIP (WITH OR WITHOUT PELVIS) 2-3V LEFT  COMPARISON:  None Available.  FINDINGS: There is diffuse decreased bone mineralization. Mild superomedial right femoroacetabular joint space narrowing. The left femoroacetabular and pubic symphysis joint spaces are maintained.  Normal morphology left femoral head-neck junction without CAM-type bump deformity.  The bilateral sacroiliac joint spaces are maintained. No subchondral sclerosis.  Partial visualization of lumbosacral posterior fusion hardware.  No acute fracture or dislocation.  IMPRESSION: Mild right femoroacetabular osteoarthritis. No significant left femoroacetabular osteoarthritis.  Normal radiographic appearance of the bilateral sacroiliac joints.  Electronically Signed   By: Neita Garnet M.D.   On: 02/27/2023 10:19 DG HIP UNILAT W OR W/O PELVIS 2-3 VIEWS LEFT CLINICAL DATA:  Chronic lower back pain.  Left hip pain.  EXAM: BILATERAL SACROILIAC JOINTS - 3+ VIEW; DG HIP (WITH OR WITHOUT PELVIS) 2-3V LEFT  COMPARISON:  None Available.  FINDINGS: There is diffuse decreased bone mineralization. Mild superomedial right femoroacetabular joint space narrowing. The left femoroacetabular and pubic symphysis  joint spaces are maintained.  Normal morphology left femoral head-neck junction without CAM-type bump deformity.  The bilateral sacroiliac joint spaces are maintained. No subchondral sclerosis.  Partial visualization of lumbosacral posterior fusion hardware.  No acute fracture or dislocation.  IMPRESSION: Mild right femoroacetabular osteoarthritis. No significant left femoroacetabular osteoarthritis.  Normal radiographic appearance of the bilateral sacroiliac joints.  Electronically Signed   By: Neita Garnet M.D.   On: 02/27/2023 10:19  Assessment  The primary encounter diagnosis was Chronic low back pain (1ry area of Pain) (Left) w/o sciatica. 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  Plan of Care  Problem-specific:  No problem-specific Assessment & Plan notes found for this encounter.  Mr. Damaria Egner has a current medication list which includes the following long-term medication(s): desloratadine, fluticasone, gabapentin, and omeprazole.  Pharmacotherapy (Medications Ordered): No orders of the defined types were placed in this encounter.  Orders:  No orders of the defined types were placed in this encounter.  Follow-up plan:   Return in about 10 days (around 05/03/2023) for (ECT): (L) L3-4 L-FCT MBB (L2-4) Blk #1.      Interventional Therapies  Risk Factors  Considerations:     Planned  Pending:   Diagnostic/therapeutic left lumbar facet MBB #1    Under consideration:   Diagnostic/therapeutic left lumbar facet MBB #1  Possible left lumbar facet RFA  Diagnostic caudal ESI #1 + diagnostic epidurogram  Possible Racz procedure  Depending on his response and effectiveness of the above treatments, we may later consider the possibility of a spinal cord stimulator trial and implant.     Completed:   None at this time   Completed by other providers:   None reported   Therapeutic  Palliative (PRN) options:   None established      Recent Visits Date Type Provider Dept  03/14/23 Office Visit Delano Metz, MD [facility]  02/26/23 Office Visit Delano Metz, MD [facility]  Showing recent visits within past 90 days and meeting all other requirements Today's Visits Date Type Provider Dept  04/23/23 Office Visit Delano Metz, MD [facility]  Showing today's visits and meeting all other requirements Future Appointments Date Type Provider Dept  05/03/23 Appointment Delano Metz, MD [facility]  Showing future appointments within next 90 days and meeting all other requirements  I discussed the assessment and treatment plan with the patient. The patient was provided an opportunity to ask questions and all were answered. The patient agreed with the plan and demonstrated an understanding of the instructions.  Patient advised to call back or seek an in-person evaluation if the symptoms or condition worsens.  Duration of encounter: 44 minutes.  Note by: Oswaldo Done, MD Date: 04/23/2023; Time: 4:47 PM

## 2023-04-23 NOTE — Patient Instructions (Signed)

## 2023-05-01 ENCOUNTER — Ambulatory Visit: Payer: Medicare HMO | Admitting: Pain Medicine

## 2023-05-03 ENCOUNTER — Encounter: Payer: Self-pay | Admitting: Pain Medicine

## 2023-05-03 ENCOUNTER — Ambulatory Visit: Payer: Medicare HMO | Attending: Pain Medicine | Admitting: Pain Medicine

## 2023-05-03 ENCOUNTER — Ambulatory Visit
Admission: RE | Admit: 2023-05-03 | Discharge: 2023-05-03 | Disposition: A | Discharge status: Home / Self Care | Payer: Medicare HMO | Source: Ambulatory Visit | Attending: Pain Medicine | Admitting: Pain Medicine

## 2023-05-03 VITALS — BP 130/82 | HR 75 | Temp 98.0°F | Resp 15 | Ht 66.0 in | Wt 157.0 lb

## 2023-05-03 DIAGNOSIS — M5459 Other low back pain: Secondary | ICD-10-CM | POA: Diagnosis not present

## 2023-05-03 DIAGNOSIS — Z5189 Encounter for other specified aftercare: Secondary | ICD-10-CM | POA: Diagnosis not present

## 2023-05-03 DIAGNOSIS — M47817 Spondylosis without myelopathy or radiculopathy, lumbosacral region: Secondary | ICD-10-CM | POA: Diagnosis not present

## 2023-05-03 DIAGNOSIS — M961 Postlaminectomy syndrome, not elsewhere classified: Secondary | ICD-10-CM | POA: Insufficient documentation

## 2023-05-03 DIAGNOSIS — M47816 Spondylosis without myelopathy or radiculopathy, lumbar region: Secondary | ICD-10-CM | POA: Insufficient documentation

## 2023-05-03 DIAGNOSIS — M545 Low back pain, unspecified: Secondary | ICD-10-CM | POA: Diagnosis not present

## 2023-05-03 DIAGNOSIS — M4316 Spondylolisthesis, lumbar region: Secondary | ICD-10-CM | POA: Diagnosis not present

## 2023-05-03 DIAGNOSIS — M25552 Pain in left hip: Secondary | ICD-10-CM | POA: Diagnosis not present

## 2023-05-03 DIAGNOSIS — G8929 Other chronic pain: Secondary | ICD-10-CM | POA: Diagnosis not present

## 2023-05-03 DIAGNOSIS — Z981 Arthrodesis status: Secondary | ICD-10-CM | POA: Diagnosis not present

## 2023-05-03 MED ORDER — FENTANYL CITRATE (PF) 100 MCG/2ML IJ SOLN
INTRAMUSCULAR | Status: AC
  Filled 2023-05-03: qty 2

## 2023-05-03 MED ORDER — PENTAFLUOROPROP-TETRAFLUOROETH EX AERO
INHALATION_SPRAY | Freq: Once | CUTANEOUS | Status: AC
  Administered 2023-05-03: 30 via TOPICAL
  Filled 2023-05-03: qty 116

## 2023-05-03 MED ORDER — LACTATED RINGERS IV SOLN: Freq: Once | INTRAVENOUS | Status: AC

## 2023-05-03 MED ORDER — LIDOCAINE HCL 2 % IJ SOLN
20.0000 mL | Freq: Once | INTRAMUSCULAR | Status: AC
  Administered 2023-05-03: 400 mg

## 2023-05-03 MED ORDER — MIDAZOLAM HCL 5 MG/5ML IJ SOLN
0.5000 mg | Freq: Once | INTRAMUSCULAR | Status: AC
  Administered 2023-05-03: 2 mg via INTRAVENOUS

## 2023-05-03 MED ORDER — ROPIVACAINE HCL 2 MG/ML IJ SOLN
9.0000 mL | Freq: Once | INTRAMUSCULAR | Status: AC
  Administered 2023-05-03: 9 mL via PERINEURAL

## 2023-05-03 MED ORDER — TRIAMCINOLONE ACETONIDE 40 MG/ML IJ SUSP
INTRAMUSCULAR | Status: AC
  Filled 2023-05-03: qty 1

## 2023-05-03 MED ORDER — LIDOCAINE HCL 2 % IJ SOLN
INTRAMUSCULAR | Status: AC
  Filled 2023-05-03: qty 20

## 2023-05-03 MED ORDER — TRIAMCINOLONE ACETONIDE 40 MG/ML IJ SUSP
40.0000 mg | Freq: Once | INTRAMUSCULAR | Status: AC
  Administered 2023-05-03: 40 mg

## 2023-05-03 MED ORDER — MIDAZOLAM HCL 5 MG/5ML IJ SOLN
INTRAMUSCULAR | Status: AC
  Filled 2023-05-03: qty 5

## 2023-05-03 MED ORDER — FENTANYL CITRATE (PF) 100 MCG/2ML IJ SOLN
25.0000 ug | INTRAMUSCULAR | Status: DC | PRN
  Administered 2023-05-03: 50 ug via INTRAVENOUS

## 2023-05-03 MED ORDER — ROPIVACAINE HCL 2 MG/ML IJ SOLN
INTRAMUSCULAR | Status: AC
  Filled 2023-05-03: qty 20

## 2023-05-03 NOTE — Progress Notes (Signed)
PROVIDER NOTE: Interpretation of information contained herein should be left to medically-trained personnel. Specific patient instructions are provided elsewhere under "Patient Instructions" section of medical record. This document was created in part using STT-dictation technology, any transcriptional errors that may result from this process are unintentional.  Patient: Donald Huff Type: Established DOB: June 28, 1956 MRN: [id number] PCP: [facility]  Service: Procedure DOS: 05/03/2023 Setting: Ambulatory Location: Ambulatory outpatient facility Delivery: Face-to-face Provider: Oswaldo Done, MD Specialty: Interventional Pain Management Specialty designation: 09 Location: Outpatient facility Ref. Prov.: Delano Metz, MD       Interventional Therapy   Procedure: Lumbar Facet, Medial Branch Block(s) #1  Laterality: Left  Level: L2, L3, L4, L5, and S1 Medial Branch Level(s). Injecting these levels blocks the L3-4, L4-5, and L5-S1 lumbar facet joints. Imaging: Fluoroscopic guidance         Anesthesia: Local anesthesia (1-2% Lidocaine) Anxiolysis: IV Versed 2.0 mg Sedation: Moderate Sedation Fentanyl 1 mL (50 mcg) DOS: 05/03/2023 Performed by: Oswaldo Done, MD  Primary Purpose: Diagnostic/Therapeutic Indications: Low back pain severe enough to impact quality of life or function. 1. Chronic low back pain (1ry area of Pain) (Left) w/o sciatica   2. Lumbar facet joint pain   3. 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 Failed back surgical syndrome x2   5. History of lumbar fusion (posterior L4-S1)   6. Chronic hip pain (2ry area of Pain) (Left)   7. Spondylosis without myelopathy or radiculopathy, lumbosacral region   8. Lumbar facet joint syndrome    NAS-11 Pain score:   Pre-procedure: 8 /10   Post-procedure: 0-No pain/10     Position / Prep / Materials:  Position: Prone  Prep solution: DuraPrep (Iodine Povacrylex [0.7% available  iodine] and Isopropyl Alcohol, 74% w/w) Area Prepped: Posterolateral Lumbosacral Spine (Wide prep: From the lower border of the scapula down to the end of the tailbone and from flank to flank.)  Materials:  Tray: Block Needle(s):  Type: Spinal  Gauge (G): 22  Length: 3.5-in Qty: 4      Pre-op H&P Assessment:  Donald Huff is a 67 y.o. (year old), male patient, seen today for interventional treatment. He  has no past surgical history on file. Donald Huff has a current medication list which includes the following prescription(s): acetaminophen, desloratadine, fluticasone, gabapentin, ibuprofen, omega-3 fatty acids, omeprazole, and tizanidine, and the following Facility-Administered Medications: fentanyl and lactated ringers. His primarily concern today is the Back Pain (lower)  Initial Vital Signs:  Pulse/HCG Rate: 75ECG Heart Rate: 83 (NSR) Temp: 98 F (36.7 C) Resp: 16 BP: 111/76 SpO2: 100 %  BMI: Estimated body mass index is 25.34 kg/m as calculated from the following:   Height as of this encounter: 5\' 6"  (1.676 m).   Weight as of this encounter: 157 lb (71.2 kg).  Risk Assessment: Allergies: Reviewed. He is allergic to oxycodone.  Allergy Precautions: None required Coagulopathies: Reviewed. None identified.  Blood-thinner therapy: None at this time Active Infection(s): Reviewed. None identified. Donald Huff is afebrile  Site Confirmation: Donald Huff was asked to confirm the procedure and laterality before marking the site Procedure checklist: Completed Consent: Before the procedure and under the influence of no sedative(s), amnesic(s), or anxiolytics, the patient was informed of the treatment options, risks and possible complications. To fulfill our ethical and legal obligations, as recommended by the American Medical Association's Code of Ethics, I have informed the patient of my clinical impression; the nature and purpose of the  treatment or procedure; the risks, benefits, and  possible complications of the intervention; the alternatives, including doing nothing; the risk(s) and benefit(s) of the alternative treatment(s) or procedure(s); and the risk(s) and benefit(s) of doing nothing. The patient was provided information about the general risks and possible complications associated with the procedure. These may include, but are not limited to: failure to achieve desired goals, infection, bleeding, organ or nerve damage, allergic reactions, paralysis, and death. In addition, the patient was informed of those risks and complications associated to Spine-related procedures, such as failure to decrease pain; infection (i.e.: Meningitis, epidural or intraspinal abscess); bleeding (i.e.: epidural hematoma, subarachnoid hemorrhage, or any other type of intraspinal or peri-dural bleeding); organ or nerve damage (i.e.: Any type of peripheral nerve, nerve root, or spinal cord injury) with subsequent damage to sensory, motor, and/or autonomic systems, resulting in permanent pain, numbness, and/or weakness of one or several areas of the body; allergic reactions; (i.e.: anaphylactic reaction); and/or death. Furthermore, the patient was informed of those risks and complications associated with the medications. These include, but are not limited to: allergic reactions (i.e.: anaphylactic or anaphylactoid reaction(s)); adrenal axis suppression; blood sugar elevation that in diabetics may result in ketoacidosis or comma; water retention that in patients with history of congestive heart failure may result in shortness of breath, pulmonary edema, and decompensation with resultant heart failure; weight gain; swelling or edema; medication-induced neural toxicity; particulate matter embolism and blood vessel occlusion with resultant organ, and/or nervous system infarction; and/or aseptic necrosis of one or more joints. Finally, the patient was informed that Medicine is not an exact science; therefore, there  is also the possibility of unforeseen or unpredictable risks and/or possible complications that may result in a catastrophic outcome. The patient indicated having understood very clearly. We have given the patient no guarantees and we have made no promises. Enough time was given to the patient to ask questions, all of which were answered to the patient's satisfaction. Mr. Irwin has indicated that he wanted to continue with the procedure. Attestation: I, the ordering provider, attest that I have discussed with the patient the benefits, risks, side-effects, alternatives, likelihood of achieving goals, and potential problems during recovery for the procedure that I have provided informed consent. Date  Time: 05/03/2023  8:09 AM   Pre-Procedure Preparation:  Monitoring: As per clinic protocol. Respiration, ETCO2, SpO2, BP, heart rate and rhythm monitor placed and checked for adequate function Safety Precautions: Patient was assessed for positional comfort and pressure points before starting the procedure. Time-out: I initiated and conducted the "Time-out" before starting the procedure, as per protocol. The patient was asked to participate by confirming the accuracy of the "Time Out" information. Verification of the correct person, site, and procedure were performed and confirmed by me, the nursing staff, and the patient. "Time-out" conducted as per Joint Commission's Universal Protocol (UP.01.01.01). Time: 0905 Start Time: 0905 hrs.  Description of Procedure:          Laterality: (see above) Targeted Levels: (see above)  Safety Precautions: Aspiration looking for blood return was conducted prior to all injections. At no point did we inject any substances, as a needle was being advanced. Before injecting, the patient was told to immediately notify me if he was experiencing any new onset of "ringing in the ears, or metallic taste in the mouth". No attempts were made at seeking any paresthesias. Safe  injection practices and needle disposal techniques used. Medications properly checked for expiration dates. SDV (single dose vial) medications  used. After the completion of the procedure, all disposable equipment used was discarded in the proper designated medical waste containers. Local Anesthesia: Protocol guidelines were followed. The patient was positioned over the fluoroscopy table. The area was prepped in the usual manner. The time-out was completed. The target area was identified using fluoroscopy. A 12-in long, straight, sterile hemostat was used with fluoroscopic guidance to locate the targets for each level blocked. Once located, the skin was marked with an approved surgical skin marker. Once all sites were marked, the skin (epidermis, dermis, and hypodermis), as well as deeper tissues (fat, connective tissue and muscle) were infiltrated with a small amount of a short-acting local anesthetic, loaded on a 10cc syringe with a 25G, 1.5-in  Needle. An appropriate amount of time was allowed for local anesthetics to take effect before proceeding to the next step. Local Anesthetic: Lidocaine 2.0% The unused portion of the local anesthetic was discarded in the proper designated containers. Technical description of process:  L2 Medial Branch Nerve Block (MBB): The target area for the L2 medial branch is at the junction of the postero-lateral aspect of the superior articular process and the superior, posterior, and medial edge of the transverse process of L3. Under fluoroscopic guidance, a Quincke needle was inserted until contact was made with os over the superior postero-lateral aspect of the pedicular shadow (target area). After negative aspiration for blood, 0.5 mL of the nerve block solution was injected without difficulty or complication. The needle was removed intact. L3 Medial Branch Nerve Block (MBB): The target area for the L3 medial branch is at the junction of the postero-lateral aspect of the  superior articular process and the superior, posterior, and medial edge of the transverse process of L4. Under fluoroscopic guidance, a Quincke needle was inserted until contact was made with os over the superior postero-lateral aspect of the pedicular shadow (target area). After negative aspiration for blood, 0.5 mL of the nerve block solution was injected without difficulty or complication. The needle was removed intact. L4 Medial Branch Nerve Block (MBB): The target area for the L4 medial branch is at the junction of the postero-lateral aspect of the superior articular process and the superior, posterior, and medial edge of the transverse process of L5. Under fluoroscopic guidance, a Quincke needle was inserted until contact was made with os over the superior postero-lateral aspect of the pedicular shadow (target area). After negative aspiration for blood, 0.5 mL of the nerve block solution was injected without difficulty or complication. The needle was removed intact. L5 Medial Branch Nerve Block (MBB): The target area for the L5 medial branch is at the junction of the postero-lateral aspect of the superior articular process and the superior, posterior, and medial edge of the sacral ala. Under fluoroscopic guidance, a Quincke needle was inserted until contact was made with os over the superior postero-lateral aspect of the pedicular shadow (target area). After negative aspiration for blood, 0.5 mL of the nerve block solution was injected without difficulty or complication. The needle was removed intact. S1 Medial Branch Nerve Block (MBB): The target area for the S1 medial branch is at the posterior and inferior 6 o'clock position of the L5-S1 facet joint. Under fluoroscopic guidance, the Quincke needle inserted for the L5 MBB was redirected until contact was made with os over the inferior and postero aspect of the sacrum, at the 6 o' clock position under the L5-S1 facet joint (Target area). After negative  aspiration for blood, 0.5 mL of the nerve  block solution was injected without difficulty or complication. The needle was removed intact.  Once the entire procedure was completed, the treated area was cleaned, making sure to leave some of the prepping solution back to take advantage of its long term bactericidal properties.         Illustration of the posterior view of the lumbar spine and the posterior neural structures. Laminae of L2 through S1 are labeled. DPRL5, dorsal primary ramus of L5; DPRS1, dorsal primary ramus of S1; DPR3, dorsal primary ramus of L3; FJ, facet (zygapophyseal) joint L3-L4; I, inferior articular process of L4; LB1, lateral branch of dorsal primary ramus of L1; IAB, inferior articular branches from L3 medial branch (supplies L4-L5 facet joint); IBP, intermediate branch plexus; MB3, medial branch of dorsal primary ramus of L3; NR3, third lumbar nerve root; S, superior articular process of L5; SAB, superior articular branches from L4 (supplies L4-5 facet joint also); TP3, transverse process of L3.   Facet Joint Innervation (* possible contribution)  L1-2 T12, L1 (L2*)  Medial Branch  L2-3 L1, L2 (L3*)         "          "  L3-4 L2, L3 (L4*)         "          "  L4-5 L3, L4 (L5*)         "          "  L5-S1 L4, L5, S1          "          "    Vitals:   05/03/23 0904 05/03/23 0909 05/03/23 0914 05/03/23 0924  BP: 131/85 106/83 97/72 108/83  Pulse:      Resp: 15 17 15 10   Temp:      SpO2: 100% 97% 98% 95%  Weight:      Height:         End Time: 0910 hrs.  Imaging Guidance (Spinal):          Type of Imaging Technique: Fluoroscopy Guidance (Spinal) Indication(s): Assistance in needle guidance and placement for procedures requiring needle placement in or near specific anatomical locations not easily accessible without such assistance. Exposure Time: Please see nurses notes. Contrast: None used. Fluoroscopic Guidance: I was personally present during the use of  fluoroscopy. "Tunnel Vision Technique" used to obtain the best possible view of the target area. Parallax error corrected before commencing the procedure. "Direction-depth-direction" technique used to introduce the needle under continuous pulsed fluoroscopy. Once target was reached, antero-posterior, oblique, and lateral fluoroscopic projection used confirm needle placement in all planes. Images permanently stored in EMR. Interpretation: No contrast injected. I personally interpreted the imaging intraoperatively. Adequate needle placement confirmed in multiple planes. Permanent images saved into the patient's record.  Post-operative Assessment:  Post-procedure Vital Signs:  Pulse/HCG Rate: 7563 Temp: 98 F (36.7 C) Resp: 10 BP: 108/83 SpO2: 95 %  EBL: None  Complications: No immediate post-treatment complications observed by team, or reported by patient.  Note: The patient tolerated the entire procedure well. A repeat set of vitals were taken after the procedure and the patient was kept under observation following institutional policy, for this type of procedure. Post-procedural neurological assessment was performed, showing return to baseline, prior to discharge. The patient was provided with post-procedure discharge instructions, including a section on how to identify potential problems. Should any problems arise concerning this procedure, the patient was given instructions to immediately contact  us, at any time, without hesitation. In any case, we plan to contact the patient by telephone for a follow-up status report regarding this interventional procedure.  Comments:  No additional relevant information.  Plan of Care (POC)  Orders:  Orders Placed This Encounter  Procedures   LUMBAR FACET(MEDIAL BRANCH NERVE BLOCK) MBNB    Scheduling Instructions:     Procedure: Lumbar facet block (AKA.: Lumbosacral medial branch nerve block)     Side: Left-sided     Level: L3-4, L4-5, L5-S1, and TBD  Facets (L2, L3, L4, L5, S1, and TBD Medial Branch Nerves)     Sedation: Patient's choice.     Timeframe: Today    Order Specific Question:   Where will this procedure be performed?    Answer:   [facility]   DG PA[facility] C-ARM 1-60 MIN NO REPORT    Intraoperative interpretation by procedural physician at [facility].    Standing Status:   Standing    Number of Occurrences:   1    Order Specific Question:   Reason for exam:    Answer:   Assistance in needle guidance and placement for procedures requiring needle placement in or near specific anatomical locations not easily accessible without such assistance.   Informed Consent Details: Physician/Practitioner Attestation; Transcribe to consent form and obtain patient signature    Nursing Order: Transcribe to consent form and obtain patient signature. Note: Always confirm laterality of pain with Mr. Talati, before procedure.    Order Specific Question:   Physician/Practitioner attestation of informed consent for procedure/surgical case    Answer:   I, the physician/practitioner, attest that I have discussed with the patient the benefits, risks, side effects, alternatives, likelihood of achieving goals and potential problems during recovery for the procedure that I have provided informed consent.    Order Specific Question:   Procedure    Answer:   Lumbar Facet Block  under fluoroscopic guidance    Order Specific Question:   Physician/Practitioner performing the procedure    Answer:   Sulamita Lafountain A. Laban Emperor MD    Order Specific Question:   Indication/Reason    Answer:   Low Back Pain, with our without leg pain, due to Facet Joint Arthralgia (Joint Pain) Spondylosis (Arthritis of the Spine), without myelopathy or radiculopathy (Nerve Damage).   Provide equipment / supplies at bedside    Procedure tray: "Block Tray" (Disposable  single use) Skin infiltration needle: Regular 1.5-in, 25-G, (x1) Block Needle type:  Spinal Amount/quantity: 4 Size: Regular (3.5-inch) Gauge: 22G    Standing Status:   Standing    Number of Occurrences:   1    Order Specific Question:   Specify    Answer:   Block Tray   Follow-up    Schedule Mr. Kitzmann for a post-procedure follow-up evaluation encounter 2 weeks from now.    Standing Status:   Future    Standing Expiration Date:   05/17/2023    Scheduling Instructions:     Schedule follow-up visit on afternoon of procedure day (T, Th)     Type: Face-to-face (F2F) Post-procedure (PP) evaluation (E/M)     When: 2 weeks from now   Chronic Opioid Analgesic:  No chronic opioid analgesics therapy prescribed by our practice. None MME/day: 0 mg/day   Medications ordered for procedure: Meds ordered this encounter  Medications   lidocaine (XYLOCAINE) 2 % (with pres) injection 400 mg   pentafluoroprop-tetrafluoroeth (GEBAUERS) aerosol   lactated ringers infusion   midazolam (  VERSED) 5 MG/5ML injection 0.5-2 mg    Make sure Flumazenil is available in the pyxis when using this medication. If oversedation occurs, administer 0.2 mg IV over 15 sec. If after 45 sec no response, administer 0.2 mg again over 1 min; may repeat at 1 min intervals; not to exceed 4 doses (1 mg)   fentaNYL (SUBLIMAZE) injection 25-50 mcg    Make sure Narcan is available in the pyxis when using this medication. In the event of respiratory depression (RR< 8/min): Titrate NARCAN (naloxone) in increments of 0.1 to 0.2 mg IV at 2-3 minute intervals, until desired degree of reversal.   ropivacaine (PF) 2 mg/mL (0.2%) (NAROPIN) injection 9 mL   triamcinolone acetonide (KENALOG-40) injection 40 mg   Medications administered: We administered lidocaine, pentafluoroprop-tetrafluoroeth, lactated ringers, midazolam, fentaNYL, ropivacaine (PF) 2 mg/mL (0.2%), and triamcinolone acetonide.  See the medical record for exact dosing, route, and time of administration.  Follow-up plan:   Return in about 2 weeks (around  05/17/2023) for Proc-day (T,Th), (Face2F), (PPE).       Interventional Therapies  Risk Factors  Considerations:  (+) Lumbar Fusion HARDWARE (NO RFA)       Planned  Pending:   Diagnostic caudal ESI #1 + diagnostic epidurogram    Under consideration:   Possible left lumbar facet RFA  Diagnostic caudal ESI #1 + diagnostic epidurogram  Possible Racz procedure  Depending on his response and effectiveness of the above treatments, we may later consider the possibility of a spinal cord stimulator trial and implant.    Completed:   Diagnostic/therapeutic left lumbar facet MBB x1 (05/03/2023) (8-0/10)   Completed by other providers:   None reported   Therapeutic  Palliative (PRN) options:   None established      Recent Visits Date Type Provider Dept  04/23/23 Office Visit Delano Metz, MD [facility]  03/14/23 Office Visit Delano Metz, MD [facility]  02/26/23 Office Visit Delano Metz, MD [facility]  Showing recent visits within past 90 days and meeting all other requirements Today's Visits Date Type Provider Dept  05/03/23 Procedure visit Delano Metz, MD [facility]  Showing today's visits and meeting all other requirements Future Appointments Date Type Provider Dept  05/17/23 Appointment Delano Metz, MD [facility]  Showing future appointments within next 90 days and meeting all other requirements  Disposition: Discharge home  Discharge (Date  Time): 05/03/2023; 0945 hrs.   Primary Care Physician: Practice, [facility] Location: [facility] Note by: Oswaldo Done, MD (TTS technology used. I apologize for any typographical errors that were not detected and corrected.) Date: 05/03/2023; Time: 9:27 AM  Disclaimer:  Medicine is not an Visual merchandiser. The only guarantee in medicine is that nothing is guaranteed. It is important to note that the  decision to proceed with this intervention was based on the information collected from the patient. The Data and conclusions were drawn from the patient's questionnaire, the interview, and the physical examination. Because the information was provided in large part by the patient, it cannot be guaranteed that it has not been purposely or unconsciously manipulated. Every effort has been made to obtain as much relevant data as possible for this evaluation. It is important to note that the conclusions that lead to this procedure are derived in large part from the available data. Always take into account that the treatment will also be dependent on availability of resources and existing treatment guidelines, considered by  other Pain Management Practitioners as being common knowledge and practice, at the time of the intervention. For Medico-Legal purposes, it is also important to point out that variation in procedural techniques and pharmacological choices are the acceptable norm. The indications, contraindications, technique, and results of the above procedure should only be interpreted and judged by a Board-Certified Interventional Pain Specialist with extensive familiarity and expertise in the same exact procedure and technique.

## 2023-05-03 NOTE — Patient Instructions (Addendum)

## 2023-05-03 NOTE — Progress Notes (Signed)
Safety precautions to be maintained throughout the outpatient stay will include: orient to surroundings, keep bed in low position, maintain call bell within reach at all times, provide assistance with transfer out of bed and ambulation.  

## 2023-05-04 ENCOUNTER — Telehealth: Payer: Self-pay

## 2023-05-04 NOTE — Telephone Encounter (Signed)
Called PP. Denies any needs at this time. Instructed to call if needed. 

## 2023-05-14 NOTE — Progress Notes (Unsigned)
PROVIDER NOTE: Information contained herein reflects review and annotations entered in association with encounter. Interpretation of such information and data should be left to medically-trained personnel. Information provided to patient can be located elsewhere in the medical record under "Patient Instructions". Document created using STT-dictation technology, any transcriptional errors that may result from process are unintentional.    Patient: Donald Huff  Service Category: E/M  Provider: Oswaldo Done, MD  DOB: 31-Mar-1956  DOS: 05/17/2023  Referring Provider: [facility]  MRN: [id number]  Specialty: Interventional Pain Management  PCP: [facility], [facility]  Type: Established Patient  Setting: Ambulatory outpatient    Location: Office  Delivery: Face-to-face     HPI  [name], a 67 y.o. year old male, is here today because of his No primary diagnosis found.. [name] primary complain today is No chief complaint on file.  Pertinent problems: 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 Pain Assessment: Severity of   is reported as a  /10. Location:    / . Onset:  . Quality:  . Timing:  . Modifying factor(s):  Marland Kitchen Vitals:  vitals were not taken for this visit.  BMI: Estimated body  mass index is 25.34 kg/m as calculated from the following:   Height as of 05/03/23: 5\' 6"  (1.676 m).   Weight as of 05/03/23: 157 lb (71.2 kg). Last encounter: 04/23/2023. Last procedure: 05/03/2023.  Reason for encounter: post-procedure evaluation and assessment. ***  Post-procedure evaluation   Procedure: Lumbar Facet, Medial Branch Block(s) #1  Laterality: Left  Level: L2, L3, L4, L5, and S1 Medial Branch Level(s). Injecting these levels blocks the L3-4, L4-5, and L5-S1 lumbar facet joints. Imaging: Fluoroscopic guidance         Anesthesia: Local anesthesia (1-2% Lidocaine) Anxiolysis: IV Versed 2.0 mg Sedation: Moderate Sedation Fentanyl 1 mL (50 mcg) DOS: 05/03/2023 Performed by: Donald Done, MD  Primary Purpose: Diagnostic/Therapeutic Indications: Low back pain severe enough to impact quality of life or function. 1. Chronic low back pain (1ry area of Pain) (Left) w/o sciatica   2. Lumbar facet joint pain   3. 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 Failed back surgical syndrome x2   5. History of lumbar fusion (posterior L4-S1)   6. Chronic hip pain (2ry area of Pain) (Left)   7. Spondylosis without myelopathy or radiculopathy, lumbosacral region   8. Lumbar facet joint syndrome    NAS-11 Pain score:   Pre-procedure: 8 /10   Post-procedure: 0-No pain/10      Effectiveness:  Initial hour after procedure:   ***. Subsequent 4-6 hours post-procedure:   ***. Analgesia past initial 6 hours:   ***. Ongoing improvement:  Analgesic:  *** Function:    ***    ROM:    ***     Pharmacotherapy Assessment  Analgesic: No chronic opioid analgesics therapy prescribed by our practice. None MME/day: 0 mg/day   Monitoring:  [place] PMP: PDMP reviewed during this encounter.       Pharmacotherapy: No side-effects or adverse reactions reported. Compliance: No problems identified. Effectiveness: Clinically acceptable.  No notes on file  No results found for:  "CBDTHCR" No results found for: "D8THCCBX" No results found for: "D9THCCBX"  UDS:  Summary  Date Value Ref Range Status  02/26/2023 Note  Final    Comment:    ==================================================================== Compliance Drug Analysis, Ur ==================================================================== Test                             Result       Flag       Units  Drug Present and Declared for Prescription Verification   Gabapentin                     PRESENT      EXPECTED  Drug Absent but Declared for Prescription Verification   Tizanidine                     Not Detected UNEXPECTED    Tizanidine, as indicated in the declared medication list, is not    always detected even when used as directed.    Acetaminophen                  Not Detected UNEXPECTED    Acetaminophen, as indicated in the declared medication list, is not    always detected even when used as directed.    Ibuprofen                      Not Detected UNEXPECTED    Ibuprofen, as indicated in the declared medication list, is not    always detected even when used as directed.  ==================================================================== Test                      Result    Flag   Units      Ref Range   Creatinine              27               mg/dL      >=78 ==================================================================== Declared Medications:  The flagging and interpretation on this report are based on the  following declared medications.  Unexpected results may arise from  inaccuracies in the declared medications.   **Note: The testing scope of this panel includes these medications:   Gabapentin (Neurontin)   **Note: The testing scope of this panel does not include small to  moderate amounts of these reported medications:   Acetaminophen  Ibuprofen (Advil)  Tizanidine (Zanaflex)   **Note: The testing scope of this panel does not include the  following reported  medications:   Desloratadine (Clarinex)  Fluticasone (Flonase)  Omega-3 Fatty Acids  Omeprazole (Prilosec) ==================================================================== For clinical consultation, please call [phone]. ====================================================================       ROS  Constitutional: Denies any fever or chills Gastrointestinal: No reported hemesis, hematochezia, vomiting, or acute GI distress Musculoskeletal: Denies any acute onset joint swelling, redness, loss of ROM, or weakness Neurological: No reported episodes of acute onset apraxia, aphasia, dysarthria, agnosia, amnesia, paralysis, loss of coordination, or loss of consciousness  Medication Review  Omega-3 Fatty Acids, acetaminophen, desloratadine, fluticasone, gabapentin, ibuprofen, omeprazole, and tiZANidine  History Review  Allergy: Donald Huff is allergic to oxycodone. Drug: Donald Huff  reports no history of  drug use. Alcohol:  has no history on file for alcohol use. Tobacco:  reports that he has never smoked. He has never been exposed to tobacco smoke. He has never used smokeless tobacco. Social: Donald Huff  reports that he has never smoked. He has never been exposed to tobacco smoke. He has never used smokeless tobacco. He reports that he does not use drugs. Medical:  has no past medical history on file. Surgical: Donald Huff  has no past surgical history on file. Family: family history is not on file.  Laboratory Chemistry Profile   Renal Lab Results  Component Value Date   BUN 18 02/26/2023   CREATININE 0.91 02/26/2023   BCR 20 02/26/2023    Hepatic Lab Results  Component Value Date   AST 24 02/26/2023   ALBUMIN 4.1 02/26/2023   ALKPHOS 74 02/26/2023    Electrolytes Lab Results  Component Value Date   NA 140 02/26/2023   K 4.7 02/26/2023   CL 103 02/26/2023   CALCIUM 9.3 02/26/2023   MG 2.2 02/26/2023    Bone Lab Results  Component Value Date   25OHVITD1 44  02/26/2023   25OHVITD2 <1.0 02/26/2023   25OHVITD3 43 02/26/2023    Inflammation (CRP: Acute Phase) (ESR: Chronic Phase) Lab Results  Component Value Date   CRP 3 02/26/2023   ESRSEDRATE 37 (H) 02/26/2023         Note: Above Lab results reviewed.  Recent Imaging Review  DG PAIN CLINIC C-ARM 1-60 MIN NO REPORT Fluoro was used, but no Radiologist interpretation will be provided.  Please refer to "NOTES" tab for provider progress note. Note: Reviewed        Physical Exam  General appearance: Well nourished, well developed, and well hydrated. In no apparent acute distress Mental status: Alert, oriented x 3 (person, place, & time)       Respiratory: No evidence of acute respiratory distress Eyes: PERLA Vitals: There were no vitals taken for this visit. BMI: Estimated body mass index is 25.34 kg/m as calculated from the following:   Height as of 05/03/23: 5\' 6"  (1.676 m).   Weight as of 05/03/23: 157 lb (71.2 kg). Ideal: Ideal body weight: 63.8 kg (140 lb 10.5 oz) Adjusted ideal body weight: 66.8 kg (147 lb 3.1 oz)  Assessment   Diagnosis Status  No diagnosis found. Controlled Controlled Controlled   Updated Problems: No problems updated.  Plan of Care  Problem-specific:  No problem-specific Assessment & Plan notes found for this encounter.  [name] has a current medication list which includes the following long-term medication(s): desloratadine, fluticasone, gabapentin, and omeprazole.  Pharmacotherapy (Medications Ordered): No orders of the defined types were placed in this encounter.  Orders:  No orders of the defined types were placed in this encounter.  Follow-up plan:   No follow-ups on file.      Interventional Therapies  Risk Factors  Considerations:  (+) Lumbar Fusion HARDWARE (NO RFA)       Planned  Pending:   Diagnostic caudal ESI #1 + diagnostic epidurogram    Under consideration:   Possible left lumbar facet RFA  Diagnostic  caudal ESI #1 + diagnostic epidurogram  Possible Racz procedure  Depending on his response and effectiveness of the above treatments, we may later consider the possibility of a spinal cord stimulator trial and implant.    Completed:   Diagnostic/therapeutic left lumbar facet MBB x1 (05/03/2023) (8-0/10)   Completed by other providers:   None reported  Therapeutic  Palliative (PRN) options:   None established       Recent Visits Date Type Provider Dept  05/03/23 Procedure visit Delano Metz, MD [facility]  04/23/23 Office Visit Delano Metz, MD [facility]  03/14/23 Office Visit Delano Metz, MD [facility]  02/26/23 Office Visit Delano Metz, MD [facility]  Showing recent visits within past 90 days and meeting all other requirements Future Appointments Date Type Provider Dept  05/17/23 Appointment Delano Metz, MD [facility]  Showing future appointments within next 90 days and meeting all other requirements  I discussed the assessment and treatment plan with the patient. The patient was provided an opportunity to ask questions and all were answered. The patient agreed with the plan and demonstrated an understanding of the instructions.  Patient advised to call back or seek an in-person evaluation if the symptoms or condition worsens.  Duration of encounter: *** minutes.  Total time on encounter, as per AMA guidelines included both the face-to-face and non-face-to-face time personally spent by the physician and/or other qualified health care professional(s) on the day of the encounter (includes time in activities that require the physician or other qualified health care professional and does not include time in activities normally performed by clinical staff). Physician's time may include the following activities when performed: Preparing to see the patient (e.g., pre-charting review of records,  searching for previously ordered imaging, lab work, and nerve conduction tests) Review of prior analgesic pharmacotherapies. Reviewing PMP Interpreting ordered tests (e.g., lab work, imaging, nerve conduction tests) Performing post-procedure evaluations, including interpretation of diagnostic procedures Obtaining and/or reviewing separately obtained history Performing a medically appropriate examination and/or evaluation Counseling and educating the patient/family/caregiver Ordering medications, tests, or procedures Referring and communicating with other health care professionals (when not separately reported) Documenting clinical information in the electronic or other health record Independently interpreting results (not separately reported) and communicating results to the patient/ family/caregiver Care coordination (not separately reported)  Note by: Donald Done, MD Date: 05/17/2023; Time: 6:14 AM

## 2023-05-17 ENCOUNTER — Ambulatory Visit: Payer: Medicare HMO | Attending: Pain Medicine | Admitting: Pain Medicine

## 2023-05-17 ENCOUNTER — Encounter: Payer: Self-pay | Admitting: Pain Medicine

## 2023-05-17 VITALS — BP 108/76 | HR 90 | Temp 97.0°F | Resp 17 | Ht 66.0 in | Wt 156.0 lb

## 2023-05-17 DIAGNOSIS — Z09 Encounter for follow-up examination after completed treatment for conditions other than malignant neoplasm: Secondary | ICD-10-CM | POA: Insufficient documentation

## 2023-05-17 DIAGNOSIS — M25552 Pain in left hip: Secondary | ICD-10-CM | POA: Insufficient documentation

## 2023-05-17 DIAGNOSIS — M47816 Spondylosis without myelopathy or radiculopathy, lumbar region: Secondary | ICD-10-CM | POA: Insufficient documentation

## 2023-05-17 DIAGNOSIS — G8929 Other chronic pain: Secondary | ICD-10-CM | POA: Insufficient documentation

## 2023-05-17 DIAGNOSIS — M5459 Other low back pain: Secondary | ICD-10-CM | POA: Insufficient documentation

## 2023-05-17 DIAGNOSIS — M545 Low back pain, unspecified: Secondary | ICD-10-CM | POA: Diagnosis not present

## 2023-05-17 NOTE — Progress Notes (Signed)
Safety precautions to be maintained throughout the outpatient stay will include: orient to surroundings, keep bed in low position, maintain call bell within reach at all times, provide assistance with transfer out of bed and ambulation.  

## 2023-05-22 ENCOUNTER — Ambulatory Visit: Payer: Medicare HMO | Admitting: Pain Medicine

## 2023-06-27 DIAGNOSIS — R634 Abnormal weight loss: Secondary | ICD-10-CM | POA: Diagnosis not present

## 2023-06-27 DIAGNOSIS — Z125 Encounter for screening for malignant neoplasm of prostate: Secondary | ICD-10-CM | POA: Diagnosis not present

## 2023-06-27 DIAGNOSIS — M545 Low back pain, unspecified: Secondary | ICD-10-CM | POA: Diagnosis not present

## 2023-06-27 DIAGNOSIS — R197 Diarrhea, unspecified: Secondary | ICD-10-CM | POA: Diagnosis not present

## 2023-06-27 DIAGNOSIS — E7800 Pure hypercholesterolemia, unspecified: Secondary | ICD-10-CM | POA: Diagnosis not present

## 2023-06-27 DIAGNOSIS — R946 Abnormal results of thyroid function studies: Secondary | ICD-10-CM | POA: Diagnosis not present

## 2023-07-03 DIAGNOSIS — R197 Diarrhea, unspecified: Secondary | ICD-10-CM | POA: Diagnosis not present

## 2023-07-03 DIAGNOSIS — J309 Allergic rhinitis, unspecified: Secondary | ICD-10-CM | POA: Diagnosis not present

## 2023-07-03 DIAGNOSIS — E7800 Pure hypercholesterolemia, unspecified: Secondary | ICD-10-CM | POA: Diagnosis not present

## 2023-07-03 DIAGNOSIS — K219 Gastro-esophageal reflux disease without esophagitis: Secondary | ICD-10-CM | POA: Diagnosis not present

## 2023-07-03 DIAGNOSIS — M545 Low back pain, unspecified: Secondary | ICD-10-CM | POA: Diagnosis not present

## 2023-08-16 DIAGNOSIS — H1010 Acute atopic conjunctivitis, unspecified eye: Secondary | ICD-10-CM | POA: Diagnosis not present

## 2023-08-16 DIAGNOSIS — Z23 Encounter for immunization: Secondary | ICD-10-CM | POA: Diagnosis not present

## 2023-08-16 DIAGNOSIS — M545 Low back pain, unspecified: Secondary | ICD-10-CM | POA: Diagnosis not present

## 2023-08-16 DIAGNOSIS — R197 Diarrhea, unspecified: Secondary | ICD-10-CM | POA: Diagnosis not present

## 2023-08-29 DIAGNOSIS — F1910 Other psychoactive substance abuse, uncomplicated: Secondary | ICD-10-CM | POA: Diagnosis not present

## 2023-08-29 DIAGNOSIS — Z79891 Long term (current) use of opiate analgesic: Secondary | ICD-10-CM | POA: Diagnosis not present

## 2023-08-29 DIAGNOSIS — G894 Chronic pain syndrome: Secondary | ICD-10-CM | POA: Diagnosis not present

## 2023-09-04 DIAGNOSIS — M7918 Myalgia, other site: Secondary | ICD-10-CM | POA: Diagnosis not present

## 2023-09-04 DIAGNOSIS — M533 Sacrococcygeal disorders, not elsewhere classified: Secondary | ICD-10-CM | POA: Diagnosis not present

## 2023-09-04 DIAGNOSIS — Z981 Arthrodesis status: Secondary | ICD-10-CM | POA: Diagnosis not present

## 2023-09-04 DIAGNOSIS — M4316 Spondylolisthesis, lumbar region: Secondary | ICD-10-CM | POA: Diagnosis not present

## 2023-09-04 DIAGNOSIS — G894 Chronic pain syndrome: Secondary | ICD-10-CM | POA: Diagnosis not present

## 2023-10-12 DIAGNOSIS — M7918 Myalgia, other site: Secondary | ICD-10-CM | POA: Diagnosis not present

## 2023-10-12 DIAGNOSIS — M533 Sacrococcygeal disorders, not elsewhere classified: Secondary | ICD-10-CM | POA: Diagnosis not present

## 2023-10-12 DIAGNOSIS — Z981 Arthrodesis status: Secondary | ICD-10-CM | POA: Diagnosis not present

## 2023-10-12 DIAGNOSIS — G8929 Other chronic pain: Secondary | ICD-10-CM | POA: Diagnosis not present

## 2023-10-24 DIAGNOSIS — R109 Unspecified abdominal pain: Secondary | ICD-10-CM | POA: Diagnosis not present

## 2023-10-24 DIAGNOSIS — R194 Change in bowel habit: Secondary | ICD-10-CM | POA: Diagnosis not present

## 2023-10-24 DIAGNOSIS — K5900 Constipation, unspecified: Secondary | ICD-10-CM | POA: Diagnosis not present

## 2024-01-08 DIAGNOSIS — M545 Low back pain, unspecified: Secondary | ICD-10-CM | POA: Diagnosis not present

## 2024-01-08 DIAGNOSIS — E7800 Pure hypercholesterolemia, unspecified: Secondary | ICD-10-CM | POA: Diagnosis not present

## 2024-01-08 DIAGNOSIS — J309 Allergic rhinitis, unspecified: Secondary | ICD-10-CM | POA: Diagnosis not present

## 2024-01-08 DIAGNOSIS — K219 Gastro-esophageal reflux disease without esophagitis: Secondary | ICD-10-CM | POA: Diagnosis not present

## 2024-02-18 DIAGNOSIS — R062 Wheezing: Secondary | ICD-10-CM | POA: Diagnosis not present

## 2024-02-18 DIAGNOSIS — J069 Acute upper respiratory infection, unspecified: Secondary | ICD-10-CM | POA: Diagnosis not present

## 2024-02-29 DIAGNOSIS — Z0100 Encounter for examination of eyes and vision without abnormal findings: Secondary | ICD-10-CM | POA: Diagnosis not present

## 2024-02-29 DIAGNOSIS — H524 Presbyopia: Secondary | ICD-10-CM | POA: Diagnosis not present

## 2024-07-08 DIAGNOSIS — M545 Low back pain, unspecified: Secondary | ICD-10-CM | POA: Diagnosis not present

## 2024-07-08 DIAGNOSIS — Z139 Encounter for screening, unspecified: Secondary | ICD-10-CM | POA: Diagnosis not present

## 2024-07-08 DIAGNOSIS — Z23 Encounter for immunization: Secondary | ICD-10-CM | POA: Diagnosis not present

## 2024-07-08 DIAGNOSIS — Z9181 History of falling: Secondary | ICD-10-CM | POA: Diagnosis not present

## 2024-07-08 DIAGNOSIS — E7800 Pure hypercholesterolemia, unspecified: Secondary | ICD-10-CM | POA: Diagnosis not present

## 2024-07-08 DIAGNOSIS — J309 Allergic rhinitis, unspecified: Secondary | ICD-10-CM | POA: Diagnosis not present

## 2024-07-08 DIAGNOSIS — Z125 Encounter for screening for malignant neoplasm of prostate: Secondary | ICD-10-CM | POA: Diagnosis not present

## 2024-09-23 DIAGNOSIS — S0502XA Injury of conjunctiva and corneal abrasion without foreign body, left eye, initial encounter: Secondary | ICD-10-CM | POA: Diagnosis not present
